# Patient Record
Sex: Female | Born: 1970 | Race: White | Hispanic: No | Marital: Married | Smoking: Never smoker
Health system: Southern US, Community
[De-identification: ages and names within clinical notes are randomized; demographics above are authoritative.]

## PROBLEM LIST (undated history)

## (undated) DIAGNOSIS — G10 Huntington's disease: Secondary | ICD-10-CM

## (undated) DIAGNOSIS — G1221 Amyotrophic lateral sclerosis: Secondary | ICD-10-CM

## (undated) DIAGNOSIS — R7303 Prediabetes: Secondary | ICD-10-CM

## (undated) HISTORY — PX: HAND SURGERY: SHX662

## (undated) HISTORY — PX: OTHER SURGICAL HISTORY: SHX169

## (undated) HISTORY — DX: Prediabetes: R73.03

---

## 2018-01-04 ENCOUNTER — Other Ambulatory Visit: Payer: Self-pay

## 2018-01-04 ENCOUNTER — Emergency Department (HOSPITAL_BASED_OUTPATIENT_CLINIC_OR_DEPARTMENT_OTHER): Payer: Medicare Other

## 2018-01-04 ENCOUNTER — Encounter (HOSPITAL_BASED_OUTPATIENT_CLINIC_OR_DEPARTMENT_OTHER): Payer: Self-pay

## 2018-01-04 ENCOUNTER — Emergency Department (HOSPITAL_BASED_OUTPATIENT_CLINIC_OR_DEPARTMENT_OTHER)
Admission: EM | Admit: 2018-01-04 | Discharge: 2018-01-04 | Disposition: A | Discharge status: Home / Self Care | Payer: Medicare Other | Attending: Emergency Medicine | Admitting: Emergency Medicine

## 2018-01-04 DIAGNOSIS — Y998 Other external cause status: Secondary | ICD-10-CM | POA: Diagnosis not present

## 2018-01-04 DIAGNOSIS — Z79899 Other long term (current) drug therapy: Secondary | ICD-10-CM | POA: Diagnosis not present

## 2018-01-04 DIAGNOSIS — S53104A Unspecified dislocation of right ulnohumeral joint, initial encounter: Secondary | ICD-10-CM | POA: Insufficient documentation

## 2018-01-04 DIAGNOSIS — W010XXA Fall on same level from slipping, tripping and stumbling without subsequent striking against object, initial encounter: Secondary | ICD-10-CM | POA: Diagnosis not present

## 2018-01-04 DIAGNOSIS — Y92017 Garden or yard in single-family (private) house as the place of occurrence of the external cause: Secondary | ICD-10-CM | POA: Diagnosis not present

## 2018-01-04 DIAGNOSIS — Q738 Other reduction defects of unspecified limb(s): Secondary | ICD-10-CM

## 2018-01-04 DIAGNOSIS — W19XXXA Unspecified fall, initial encounter: Secondary | ICD-10-CM

## 2018-01-04 DIAGNOSIS — S59901A Unspecified injury of right elbow, initial encounter: Secondary | ICD-10-CM | POA: Diagnosis present

## 2018-01-04 DIAGNOSIS — Y9301 Activity, walking, marching and hiking: Secondary | ICD-10-CM | POA: Insufficient documentation

## 2018-01-04 HISTORY — DX: Huntington's disease: G10

## 2018-01-04 LAB — HCG, SERUM, QUALITATIVE: Preg, Serum: NEGATIVE

## 2018-01-04 MED ORDER — SODIUM CHLORIDE 0.9 % IV SOLN: 1000.0000 mL | INTRAVENOUS | Status: DC

## 2018-01-04 MED ORDER — PROPOFOL 10 MG/ML IV BOLUS
40.0000 mg | Freq: Once | INTRAVENOUS | Status: AC
  Administered 2018-01-04: 80 mg via INTRAVENOUS
  Filled 2018-01-04: qty 20

## 2018-01-04 MED ORDER — SODIUM CHLORIDE 0.9 % IV BOLUS (SEPSIS)
1000.0000 mL | Freq: Once | INTRAVENOUS | Status: AC
  Administered 2018-01-04: 1000 mL via INTRAVENOUS

## 2018-01-04 MED ORDER — MORPHINE SULFATE (PF) 4 MG/ML IV SOLN
4.0000 mg | Freq: Once | INTRAVENOUS | Status: AC
  Administered 2018-01-04: 4 mg via INTRAVENOUS
  Filled 2018-01-04: qty 1

## 2018-01-04 MED ORDER — HYDROCODONE-ACETAMINOPHEN 5-325 MG PO TABS
2.0000 | ORAL_TABLET | Freq: Once | ORAL | Status: AC
  Administered 2018-01-04: 2 via ORAL
  Filled 2018-01-04: qty 2

## 2018-01-04 NOTE — ED Triage Notes (Signed)
Pt fell on steps approx 1215-pain to right UE-to triage in w/c

## 2018-01-04 NOTE — ED Provider Notes (Signed)
[facility] EMERGENCY DEPARTMENT Provider Note   CSN: [id number] Arrival date & time: 01/04/18  1253     History   Chief Complaint Chief Complaint  Patient presents with  . Fall    HPI Tanay Massiah is a 47 y.o. female.  HPI  Patient is a 47 year old female with a history of Huntington's disease presenting for fall and right arm pain.  Patient reports that she was walking out of her home today when she slipped on the mud and fell from a standing position.  Patient reports falling down onto her right shoulder and has subsequent pain rating from the right shoulder down to the right elbow.  Patient denies any head or neck trauma in this incident.  Patient denies any syncope reports this is a purely mechanical fall.  Patient denies any loss of sensation in the right arm distal to the injury.  Patient denies any hip or lower extremity pain.  Patient denies taking any blood thinners.  No remedies given prior to arrival for pain.  Past Medical History:  Diagnosis Date  . Huntington disease (HCC)     There are no active problems to display for this patient.   Past Surgical History:  Procedure Laterality Date  . HAND SURGERY       OB History   None      Home Medications    Prior to Admission medications   Medication Sig Start Date End Date Taking? Authorizing Provider  Clindamycin-Benzoyl Per, Refr, gel APP TO AFFECTED AREAS OF SKIN BID 12/13/17   [provider]  sertraline (ZOLOFT) 50 MG tablet TK 1 T PO IN THE MORNING 12/30/17   [provider]  traZODone (DESYREL) 50 MG tablet TK 1 T PO NIGHTLY 10/15/17   [provider]    Family History No family history on file.  Social History Social History   Tobacco Use  . Smoking status: Never Smoker  . Smokeless tobacco: Never Used  Substance Use Topics  . Alcohol use: Never    Frequency: Never  . Drug use: Never     Allergies   Patient has no known allergies.   Review of  Systems Review of Systems  Gastrointestinal: Negative for nausea and vomiting.  Musculoskeletal: Positive for arthralgias and joint swelling. Negative for back pain.  Skin: Negative for wound.  Neurological: Negative for syncope, weakness and numbness.  All other systems reviewed and are negative.    Physical Exam Updated Vital Signs BP (!) 125/108 (BP Location: Left Arm)   Pulse 79   Temp (!) 97.3 F (36.3 C) (Oral)   Resp 18   Ht 5\' 4"  (1.626 m)   Wt 68 kg (150 lb)   SpO2 99%   BMI 25.75 kg/m   Physical Exam  Constitutional: She appears well-developed and well-nourished.  HENT:  Head: Normocephalic and atraumatic.  Mouth/Throat: Oropharynx is clear and moist.  Eyes: Pupils are equal, round, and reactive to light. Conjunctivae and EOM are normal.  Neck: Normal range of motion. Neck supple.  Cardiovascular: Normal rate, regular rhythm, S1 normal and S2 normal.  No murmur heard. Pulmonary/Chest: Effort normal and breath sounds normal. She has no wheezes. She has no rales.  Abdominal: Soft. She exhibits no distension. There is no tenderness. There is no guarding.  Musculoskeletal:  Patient lying with the right arm in a slightly flexed position.  There is no step-off appreciated of the right shoulder.  No significant deformity of right humerus noted.  Patient  does have swelling noted to the right elbow. All sensation intact in the distal right fingers.  2+ radial pulse in the right upper extremity. Left shoulder, left arm, left elbow, and left wrist without tenderness to palpation. Bilateral hips and lowe extremities without tenderness.  Lymphadenopathy:    She has no cervical adenopathy.  Neurological: She is alert.  Cranial nerves grossly intact. Patient moves extremities symmetrically and with good coordination.  Skin: Skin is warm and dry. No rash noted. No erythema.  Psychiatric: She has a normal mood and affect. Her behavior is normal. Judgment and thought content  normal.  Nursing note and vitals reviewed.    ED Treatments / Results  Labs (all labs ordered are listed, but only abnormal results are displayed) Labs Reviewed - No data to display  EKG None  Radiology No results found.  Procedures Procedures (including critical care time)  Medications Ordered in ED Medications  HYDROcodone-acetaminophen (NORCO/VICODIN) 5-325 MG per tablet 2 tablet (has no administration in time range)     Initial Impression / Assessment and Plan / ED Course  I have reviewed the triage vital signs and the nursing notes.  Pertinent labs & imaging results that were available during my care of the patient were reviewed by me and considered in my medical decision making (see chart for details).     Patient well-appearing and neurovascularly intact in the right upper extremity.  X-ray demonstrated dislocation of the right elbow.  This was successfully reduced, under conscious sedation per Dr. Donnald GarrePfeiffer.  Postreduction films demonstrate possibly chronic bone fragment of the lateral epicondyle, but given that patient is splinted in a sugar tong, will have patient follow-up with orthopedics regarding this finding.  Patient remained neurovascularly intact after procedure.  Significant improvement in pain occurred after, therefore doubt further injury.  Patient returned to baseline, and was tolerating both solids and liquids after recovering from conscious sedation.  Patient is to follow-up with orthopedics within the next week.  Patient was given proper splint care instructions, and instructed to return for any increase in swelling of the fingers, discoloration, loss of sensation of the fingers of right hand.  Patient instructed to take Tylenol for discomfort/muscle soreness.  Patient is understanding and agrees with the plan of care.  This is a shared visit with Dr. Arby BarretteMarcy Pfeiffer. Patient was independently evaluated by this attending physician. Attending physician  consulted in evaluation and discharge management.  Final Clinical Impressions(s) / ED Diagnoses   Final diagnoses:  Dislocation of right elbow, initial encounter  Fall, initial encounter    ED Discharge Orders    None       Delia ChimesMurray, Keevon Henney B, PA-C 01/04/18 1706    Arby BarrettePfeiffer, Marcy, MD 01/05/18 [phone]

## 2018-01-04 NOTE — ED Notes (Signed)
PMS intact before and after. Pt tolerated well. All questions answered. 

## 2018-01-04 NOTE — ED Provider Notes (Signed)
Medical screening examination/treatment/procedure(s) were conducted as a shared visit with non-physician practitioner(s) and myself.  I personally evaluated the patient during the encounter.  None  Physical Exam  BP 120/74   Pulse 88   Temp 98.3 F (36.8 C) (Oral)   Resp 17   Ht 5\' 4"  (1.626 m)   Wt 68 kg (150 lb)   LMP 12/21/2017   SpO2 100%   BMI 25.75 kg/m   Physical Exam  Constitutional: She is oriented to person, place, and time. She appears well-developed and well-nourished.  Patient is alert and appropriate.  He does have Huntington's chorea and some Denyse Amass form movement.  She is however alert and follows commands.  No respiratory distress.  HENT:  Head: Normocephalic and atraumatic.  Mouth/Throat: Oropharynx is clear and moist.  Posterior oropharynx widely patent.  Eyes: EOM are normal.  Neck: Neck supple.  No C-spine tenderness.  Cardiovascular: Normal rate, regular rhythm and normal heart sounds.  Pulmonary/Chest: Effort normal and breath sounds normal. She exhibits no tenderness.  Musculoskeletal:  Large swelling and protrusion of olecranon on right arm.  Normal radial pulse.  Normal bilateral lower extremity range of motion without pain.  Neurological: She is alert and oriented to person, place, and time. She exhibits normal muscle tone.  Skin: Skin is warm and dry.  Psychiatric: She has a normal mood and affect.    ED Course/Procedures     .Sedation Date/Time: 01/04/2018 3:00 PM Performed by: Arby Barrette, MD Authorized by: Arby Barrette, MD   Consent:    Consent obtained:  Verbal   Consent given by:  Patient   Risks discussed:  Allergic reaction, dysrhythmia, inadequate sedation, nausea, prolonged hypoxia resulting in organ damage, prolonged sedation necessitating reversal, respiratory compromise necessitating ventilatory assistance and intubation and vomiting   Alternatives discussed:  Analgesia without sedation, anxiolysis and regional  anesthesia Universal protocol:    Procedure explained and questions answered to patient or proxy's satisfaction: yes     Relevant documents present and verified: yes     Test results available and properly labeled: yes     Imaging studies available: yes     Required blood products, implants, devices, and special equipment available: yes     Site/side marked: yes     Immediately prior to procedure a time out was called: yes     Patient identity confirmation method:  Verbally with patient Indications:    Procedure necessitating sedation performed by:  Physician performing sedation   Intended level of sedation:  Deep Pre-sedation assessment:    Time since last food or drink:  2 huor   ASA classification: class 1 - normal, healthy patient     Neck mobility: normal     Mouth opening:  3 or more finger widths   Thyromental distance:  4 finger widths   Mallampati score:  I - soft palate, uvula, fauces, pillars visible   Pre-sedation assessments completed and reviewed: airway patency, cardiovascular function, hydration status, mental status, nausea/vomiting, pain level, respiratory function and temperature   Immediate pre-procedure details:    Reassessment: Patient reassessed immediately prior to procedure     Reviewed: vital signs, relevant labs/tests and NPO status     Verified: bag valve mask available, emergency equipment available, intubation equipment available, IV patency confirmed, oxygen available and suction available   Procedure details (see MAR for exact dosages):    Preoxygenation:  Nasal cannula   Sedation:  Propofol   Intra-procedure monitoring:  Blood pressure monitoring, cardiac monitor, continuous  pulse oximetry, frequent LOC assessments, frequent vital sign checks and continuous capnometry   Intra-procedure events: none     Total Provider sedation time (minutes):  20 Post-procedure details:    Attendance: Constant attendance by certified staff until patient recovered      Recovery: Patient returned to pre-procedure baseline     Post-sedation assessments completed and reviewed: airway patency, cardiovascular function, hydration status, mental status, nausea/vomiting, pain level, respiratory function and temperature     Patient is stable for discharge or admission: yes     Patient tolerance:  Tolerated well, no immediate complications Reduction of dislocation Date/Time: 01/04/2018 3:40 PM Performed by: Arby BarrettePfeiffer, Jamarii Banks, MD Authorized by: Arby BarrettePfeiffer, Naraly Fritcher, MD  Consent: Verbal consent obtained. Written consent obtained. Consent given by: spouse and patient Patient understanding: patient states understanding of the procedure being performed Patient consent: the patient's understanding of the procedure matches consent given Procedure consent: procedure consent matches procedure scheduled Relevant documents: relevant documents present and verified Test results: test results available and properly labeled Imaging studies: imaging studies available Patient identity confirmed: verbally with patient and arm band Time out: Immediately prior to procedure a "time out" was called to verify the correct patient, procedure, equipment, support staff and site/side marked as required. Local anesthesia used: no  Anesthesia: Local anesthesia used: no  Sedation: Patient sedated: yes Sedation type: moderate (conscious) sedation Sedatives: propofol Sedation start date/time: 01/04/2018 3:00 PM Sedation end date/time: 01/04/2018 3:30 PM Vitals: Vital signs were monitored during sedation.  Patient tolerance: Patient tolerated the procedure well with no immediate complications Comments: Right elbow dislocation reduced by traction countertraction with extension of the arm.  Relocated without difficulty.  Good range of motion after relocation.  Patient neurovascularly intact with 2+ radial pulse.  Postreduction x-ray 2 views reviewed by myself.  Reduction shows good placement and  alignment without evident fracture.  Will also review radiologist postreduction interpretation.     MDM  Patient had mechanical fall as outlined above.  Isolated elbow dislocation identified.  Relocation is indicated in sedation and reduction notes.  Patient tolerated well.       Arby BarrettePfeiffer, Sheyenne Konz, MD 01/04/18 [phone]

## 2018-01-04 NOTE — Discharge Instructions (Signed)
Please see the information and instructions below regarding your visit.  Your diagnoses today include:  1. Dislocation of right elbow, initial encounter   2. Reduction defect of extremity   3. Fall, initial encounter     Tests performed today include: See side panel of your discharge paperwork for testing performed today. Vital signs are listed at the bottom of these instructions.   Your x-ray showed he had a dislocation, which we relocated.  The repeat x-ray shows that she may have a little bone spur that the radiologist was not completely sure if it was related to your fall, however we will keep you splinted until you see the orthopedic doctor.  Your shoulder x-ray was normal.  It showed no evidence of dislocation or fracture.  Medications prescribed:    Take any prescribed medications only as prescribed, and any over the counter medications only as directed on the packaging.  Please take Tylenol, 650 mg every 6 hours as needed for discomfort.  Home care instructions:  Please follow any educational materials contained in this packet.   Follow-up instructions:  Please follow up with Dr. Magnus IvanBlackman in one week for reevaluation.  Return instructions:  Please return to the Emergency Department if you experience worsening symptoms.  Please return to the emergency department if you have any persistent swelling, discoloration, or loss of sensation in the fingers of your right hand and even after loosening of the splint. Please return if you have any other emergent concerns.  Additional Information:   Your vital signs today were: BP 132/63    Pulse 88    Temp 98.3 F (36.8 C) (Oral)    Resp 11    Ht 5\' 4"  (1.626 m)    Wt 68 kg (150 lb)    LMP 12/21/2017    SpO2 98%    BMI 25.75 kg/m  If your blood pressure (BP) was elevated on multiple readings during this visit above 130 for the top number or above 80 for the bottom number, please have this repeated by your primary care provider  within one month. --------------  Thank you for allowing us to participate in your care today.

## 2018-01-04 NOTE — Sedation Documentation (Signed)
PT sitting up in bed, talking. States she is feeling much better. Asking for a drink.

## 2018-01-11 ENCOUNTER — Ambulatory Visit (INDEPENDENT_AMBULATORY_CARE_PROVIDER_SITE_OTHER): Payer: Medicare Other | Admitting: Orthopaedic Surgery

## 2018-01-11 ENCOUNTER — Encounter (INDEPENDENT_AMBULATORY_CARE_PROVIDER_SITE_OTHER): Payer: Self-pay | Admitting: Orthopaedic Surgery

## 2018-01-11 DIAGNOSIS — S53104A Unspecified dislocation of right ulnohumeral joint, initial encounter: Secondary | ICD-10-CM | POA: Diagnosis not present

## 2018-01-11 NOTE — Progress Notes (Signed)
   Office Visit Note   Patient: Barbara Gallegos           Date of Birth: Jul 23, 1970           MRN: [id number] Visit Date: 01/11/2018              Requested by: No referring provider defined for this encounter. PCP: Patient, No Pcp Per   Assessment & Plan: Visit Diagnoses:  1. Elbow dislocation, right, initial encounter     Plan: At this point she does not need to be in any type of splint now and can work on elbow motion but should avoid any type of contact sports for at least another 6 weeks to allow the elbow  ligaments to stiffen up.  She can wear a sling for another week for comfort but does not need any splinting.  All questions and concerns were answered and addressed.  Follow-up will be as needed.  Follow-Up Instructions: Return if symptoms worsen or fail to improve.   Orders:  No orders of the defined types were placed in this encounter.  No orders of the defined types were placed in this encounter.     Procedures: No procedures performed   Clinical Data: No additional findings.   Subjective: Chief Complaint  Patient presents with  . Right Elbow - Injury  The patient is a very pleasant 47 year old female with Huntington's disease who had a mechanical fall a week ago landing on mud landing on her outstretched right arm.  She sustained a dislocation of the right elbow.  This was reduced at outlying medical facility.  She is placed in a splint and given follow-up in our office.  She does report a lot of recent falls as a relates to her Huntington's chorea.  She denies any numbness and tingling in her right hand.  HPI  Review of Systems She currently denies any headache, chest pain, shortness of breath, fever, chills, nausea, vomiting.  Objective: Vital Signs: LMP 12/21/2017   Physical Exam She is alert and oriented x3 and in no acute distress Ortho Exam Examination of the right upper extremity shows extensive bruising from the arm all the way down the hand.   Her elbow has full flexion almost full extension.  Her pronation and supination are full with some mild elbow pain.  The elbow feels like mostly stable.  Distally her motor and sensory exam is entirely normal and her right hand. Specialty Comments:  No specialty comments available.  Imaging: No results found. Independent review of x-rays of her right elbow show pre-and postreduction films.  The elbow joint is well aligned post reduction.  PMFS History: There are no active problems to display for this patient.  Past Medical History:  Diagnosis Date  . Huntington disease [facility](HCC)     History reviewed. No pertinent family history.  Past Surgical History:  Procedure Laterality Date  . HAND SURGERY     Social History   Occupational History  . Not on file  Tobacco Use  . Smoking status: Never Smoker  . Smokeless tobacco: Never Used  Substance and Sexual Activity  . Alcohol use: Never    Frequency: Never  . Drug use: Never  . Sexual activity: Not on file

## 2018-07-15 ENCOUNTER — Other Ambulatory Visit: Payer: Self-pay

## 2018-07-15 ENCOUNTER — Emergency Department (HOSPITAL_BASED_OUTPATIENT_CLINIC_OR_DEPARTMENT_OTHER): Payer: Medicare Other

## 2018-07-15 ENCOUNTER — Emergency Department (HOSPITAL_BASED_OUTPATIENT_CLINIC_OR_DEPARTMENT_OTHER)
Admission: EM | Admit: 2018-07-15 | Discharge: 2018-07-15 | Disposition: A | Discharge status: Home / Self Care | Payer: Medicare Other | Attending: Emergency Medicine | Admitting: Emergency Medicine

## 2018-07-15 ENCOUNTER — Encounter (HOSPITAL_BASED_OUTPATIENT_CLINIC_OR_DEPARTMENT_OTHER): Payer: Self-pay | Admitting: *Deleted

## 2018-07-15 DIAGNOSIS — N390 Urinary tract infection, site not specified: Secondary | ICD-10-CM | POA: Insufficient documentation

## 2018-07-15 DIAGNOSIS — Y929 Unspecified place or not applicable: Secondary | ICD-10-CM | POA: Insufficient documentation

## 2018-07-15 DIAGNOSIS — Y998 Other external cause status: Secondary | ICD-10-CM | POA: Insufficient documentation

## 2018-07-15 DIAGNOSIS — Z79899 Other long term (current) drug therapy: Secondary | ICD-10-CM | POA: Diagnosis not present

## 2018-07-15 DIAGNOSIS — W19XXXA Unspecified fall, initial encounter: Secondary | ICD-10-CM

## 2018-07-15 DIAGNOSIS — Y9389 Activity, other specified: Secondary | ICD-10-CM | POA: Diagnosis not present

## 2018-07-15 DIAGNOSIS — S0101XA Laceration without foreign body of scalp, initial encounter: Secondary | ICD-10-CM | POA: Insufficient documentation

## 2018-07-15 DIAGNOSIS — S0990XA Unspecified injury of head, initial encounter: Secondary | ICD-10-CM | POA: Diagnosis present

## 2018-07-15 DIAGNOSIS — W0110XA Fall on same level from slipping, tripping and stumbling with subsequent striking against unspecified object, initial encounter: Secondary | ICD-10-CM | POA: Insufficient documentation

## 2018-07-15 LAB — CBC WITH DIFFERENTIAL/PLATELET
Abs Immature Granulocytes: 0.04 10*3/uL (ref 0.00–0.07)
BASOS PCT: 0 %
Basophils Absolute: 0.1 10*3/uL (ref 0.0–0.1)
EOS ABS: 0.1 10*3/uL (ref 0.0–0.5)
EOS PCT: 1 %
HCT: 42.3 % (ref 36.0–46.0)
Hemoglobin: 13.6 g/dL (ref 12.0–15.0)
Immature Granulocytes: 0 %
Lymphocytes Relative: 6 %
Lymphs Abs: 0.9 10*3/uL (ref 0.7–4.0)
MCH: 28.5 pg (ref 26.0–34.0)
MCHC: 32.2 g/dL (ref 30.0–36.0)
MCV: 88.7 fL (ref 80.0–100.0)
Monocytes Absolute: 0.5 10*3/uL (ref 0.1–1.0)
Monocytes Relative: 3 %
Neutro Abs: 13.2 10*3/uL — ABNORMAL HIGH (ref 1.7–7.7)
Neutrophils Relative %: 90 %
Platelets: 320 10*3/uL (ref 150–400)
RBC: 4.77 MIL/uL (ref 3.87–5.11)
RDW: 12.3 % (ref 11.5–15.5)
WBC: 14.8 10*3/uL — AB (ref 4.0–10.5)
nRBC: 0 % (ref 0.0–0.2)

## 2018-07-15 LAB — COMPREHENSIVE METABOLIC PANEL
ALK PHOS: 45 U/L (ref 38–126)
ALT: 14 U/L (ref 0–44)
AST: 20 U/L (ref 15–41)
Albumin: 4.5 g/dL (ref 3.5–5.0)
Anion gap: 7 (ref 5–15)
BILIRUBIN TOTAL: 0.6 mg/dL (ref 0.3–1.2)
BUN: 17 mg/dL (ref 6–20)
CALCIUM: 9.2 mg/dL (ref 8.9–10.3)
CHLORIDE: 106 mmol/L (ref 98–111)
CO2: 25 mmol/L (ref 22–32)
CREATININE: 0.65 mg/dL (ref 0.44–1.00)
Glucose, Bld: 159 mg/dL — ABNORMAL HIGH (ref 70–99)
Potassium: 4 mmol/L (ref 3.5–5.1)
Sodium: 138 mmol/L (ref 135–145)
TOTAL PROTEIN: 8.1 g/dL (ref 6.5–8.1)

## 2018-07-15 LAB — URINALYSIS, ROUTINE W REFLEX MICROSCOPIC
BILIRUBIN URINE: NEGATIVE
Glucose, UA: NEGATIVE mg/dL
Ketones, ur: NEGATIVE mg/dL
Leukocytes, UA: NEGATIVE
Nitrite: NEGATIVE
Protein, ur: NEGATIVE mg/dL
Specific Gravity, Urine: 1.01 (ref 1.005–1.030)
pH: 5.5 (ref 5.0–8.0)

## 2018-07-15 LAB — URINALYSIS, MICROSCOPIC (REFLEX)

## 2018-07-15 LAB — PREGNANCY, URINE: PREG TEST UR: NEGATIVE

## 2018-07-15 MED ORDER — CEPHALEXIN 500 MG PO CAPS: 500.0000 mg | ORAL_CAPSULE | Freq: Two times a day (BID) | ORAL | 0 refills | Status: DC

## 2018-07-15 MED ORDER — SODIUM CHLORIDE 0.9 % IV BOLUS
500.0000 mL | Freq: Once | INTRAVENOUS | Status: AC
  Administered 2018-07-15: 500 mL via INTRAVENOUS

## 2018-07-15 MED ORDER — SODIUM CHLORIDE 0.9 % IV SOLN
INTRAVENOUS | Status: DC | PRN
  Administered 2018-07-15: 250 mL via INTRAVENOUS

## 2018-07-15 MED ORDER — SODIUM CHLORIDE 0.9 % IV SOLN
1.0000 g | Freq: Once | INTRAVENOUS | Status: AC
  Administered 2018-07-15: 1 g via INTRAVENOUS
  Filled 2018-07-15: qty 10

## 2018-07-15 MED ORDER — ONDANSETRON 4 MG PO TBDP: 4.0000 mg | ORAL_TABLET | Freq: Three times a day (TID) | ORAL | 0 refills | Status: DC | PRN

## 2018-07-15 MED ORDER — IBUPROFEN 400 MG PO TABS
600.0000 mg | ORAL_TABLET | Freq: Once | ORAL | Status: AC
  Administered 2018-07-15: 600 mg via ORAL
  Filled 2018-07-15: qty 1

## 2018-07-15 NOTE — ED Notes (Signed)
Pt awre we need urine specimen

## 2018-07-15 NOTE — ED Provider Notes (Signed)
[facility] EMERGENCY DEPARTMENT Provider Note   CSN: [id number] Arrival date & time: 07/15/18  1442     History   Chief Complaint Chief Complaint  Patient presents with  . Fall    HPI Barbara Gallegos is a 47 y.o. female.  The history is provided by the patient and a significant other. No language interpreter was used.  Fall    Barbara Gallegos is a 47 y.o. female who presents to the Emergency Department complaining of fall. Presents to the emergency department accompanied by her husband for evaluation of injuries following a fall. She has a history of Huntington disease and does have frequent falls. She is been feeling poorly today and became sick last night with fever, cough, congestion, nausea. When she is ill she gets what she describes as fatigue and brain fogs and she is at higher risk for falls. Today she fell and hit her head. Unsure if she had loss of consciousness. She currently feels like she is at her baseline. She does have four children at home that have been sick with URI symptoms. She did not receive a flu shot this year. She does not take any blood thinners. No vomiting. No diarrhea, dysuria. Past Medical History:  Diagnosis Date  . Huntington disease (HCC)     There are no active problems to display for this patient.   Past Surgical History:  Procedure Laterality Date  . HAND SURGERY       OB History   No obstetric history on file.      Home Medications    Prior to Admission medications   Medication Sig Start Date End Date Taking? Authorizing Provider  cephALEXin (KEFLEX) 500 MG capsule Take 1 capsule (500 mg total) by mouth 2 (two) times daily. 07/15/18   Tilden Fossa, MD  Clindamycin-Benzoyl Per, Refr, gel APP TO AFFECTED AREAS OF SKIN BID 12/13/17   [provider]  ondansetron (ZOFRAN ODT) 4 MG disintegrating tablet Take 1 tablet (4 mg total) by mouth every 8 (eight) hours as needed for nausea or vomiting. 07/15/18   Tilden Fossa, MD  sertraline (ZOLOFT) 50 MG tablet TK 1 T PO IN THE MORNING 12/30/17   [provider]  traZODone (DESYREL) 50 MG tablet TK 1 T PO NIGHTLY 10/15/17   [provider]    Family History History reviewed. No pertinent family history.  Social History Social History   Tobacco Use  . Smoking status: Never Smoker  . Smokeless tobacco: Never Used  Substance Use Topics  . Alcohol use: Never    Frequency: Never  . Drug use: Never     Allergies   Patient has no known allergies.   Review of Systems Review of Systems  All other systems reviewed and are negative.    Physical Exam Updated Vital Signs BP (!) 118/54 (BP Location: Right Arm)   Pulse 79   Temp 98.3 F (36.8 C) (Oral)   Resp 16   Ht 5\' 4"  (1.626 m)   Wt 72.6 kg   LMP 06/24/2018   SpO2 99%   BMI 27.46 kg/m   Physical Exam Vitals signs and nursing note reviewed.  Constitutional:      Appearance: She is well-developed.  HENT:     Head: Normocephalic.     Comments: 3 cm laceration to the occipital scalp Neck:     Musculoskeletal: Neck supple.  Cardiovascular:     Rate and Rhythm: Normal rate and regular rhythm.  Heart sounds: No murmur.  Pulmonary:     Effort: Pulmonary effort is normal. No respiratory distress.     Breath sounds: Normal breath sounds.  Abdominal:     Palpations: Abdomen is soft.     Tenderness: There is no abdominal tenderness. There is no guarding or rebound.  Musculoskeletal:        General: No tenderness.  Skin:    General: Skin is warm and dry.     Capillary Refill: Capillary refill takes less than 2 seconds.  Neurological:     Mental Status: She is alert and oriented to person, place, and time.  Psychiatric:        Mood and Affect: Mood normal.        Behavior: Behavior normal.      ED Treatments / Results  Labs (all labs ordered are listed, but only abnormal results are displayed) Labs Reviewed  URINALYSIS, ROUTINE W REFLEX MICROSCOPIC -  Abnormal; Notable for the following components:      Result Value   Hgb urine dipstick TRACE (*)    All other components within normal limits  COMPREHENSIVE METABOLIC PANEL - Abnormal; Notable for the following components:   Glucose, Bld 159 (*)    All other components within normal limits  CBC WITH DIFFERENTIAL/PLATELET - Abnormal; Notable for the following components:   WBC 14.8 (*)    Neutro Abs 13.2 (*)    All other components within normal limits  URINALYSIS, MICROSCOPIC (REFLEX) - Abnormal; Notable for the following components:   Bacteria, UA MANY (*)    All other components within normal limits  URINE CULTURE  PREGNANCY, URINE    EKG None  Radiology Dg Chest 2 View  Result Date: 07/15/2018 CLINICAL DATA:  Syncopal episode this am, fell and hit back of head. Huntington's dz. Fever, weak, and cough since last pm. EXAM: CHEST - 2 VIEW COMPARISON:  None. FINDINGS: Subtle shallow inspiration heart size is normal. The lungs are free of consolidations and pleural effusions. No pneumothorax or displaced fractures. IMPRESSION: Low lung volumes. No evidence for acute cardiopulmonary abnormality. Electronically Signed   By: Norva PavlovElizabeth  Brown M.D.   On: 07/15/2018 17:06    Procedures .Marland Kitchen.Laceration Repair Date/Time: 07/15/2018 5:42 PM Performed by: Tilden Fossaees, Kamen Hanken, MD Authorized by: Tilden Fossaees, Travious Vanover, MD   Consent:    Consent obtained:  Verbal   Consent given by:  Patient   Risks discussed:  Infection and pain Laceration details:    Location:  Scalp   Scalp location:  Occipital   Length (cm):  3 Repair type:    Repair type:  Simple Treatment:    Area cleansed with:  Saline   Amount of cleaning:  Standard Skin repair:    Repair method:  Staples   Number of staples:  2 Post-procedure details:    Dressing:  Open (no dressing)   Patient tolerance of procedure:  Tolerated well, no immediate complications   (including critical care time)  Medications Ordered in ED Medications   sodium chloride 0.9 % bolus 500 mL (0 mLs Intravenous Stopped 07/15/18 1757)  ibuprofen (ADVIL,MOTRIN) tablet 600 mg (600 mg Oral Given 07/15/18 1809)  cefTRIAXone (ROCEPHIN) 1 g in sodium chloride 0.9 % 100 mL IVPB (0 g Intravenous Stopped 07/15/18 1948)     Initial Impression / Assessment and Plan / ED Course  I have reviewed the triage vital signs and the nursing notes.  Pertinent labs & imaging results that were available during my care of the patient were  reviewed by me and considered in my medical decision making (see chart for details).    Patient with history of Huntington's disease here for evaluation of injuries following a fall. She has a scalp laceration that was repaired per procedure note. No concerning features for serious closed head injury. In terms of her fall she has been ill recently with fevers, nausea, cough. Chest x-ray with no evidence of pneumonia. UA is concerning for UTI in setting of her symptoms. CBC with mild leukocytosis. No concerning features for sepsis at this time. She was treated with Rocephin in the emergency department for UTI with prescription for Keflex. Discussed with patient and husband home care for UTI as well as scalp laceration. Return precautions discussed.  Final Clinical Impressions(s) / ED Diagnoses   Final diagnoses:  Fall, initial encounter  Laceration of scalp, initial encounter  Acute UTI    ED Discharge Orders         Ordered    cephALEXin (KEFLEX) 500 MG capsule  2 times daily     07/15/18 1905    ondansetron (ZOFRAN ODT) 4 MG disintegrating tablet  Every 8 hours PRN     07/15/18 1905           Tilden Fossaees, Maxen Rowland, MD 07/16/18 [id number]

## 2018-07-15 NOTE — ED Notes (Signed)
Patient transported to X-ray 

## 2018-07-15 NOTE — ED Notes (Signed)
Stapler and Wound cleanse solution at bedside

## 2018-07-15 NOTE — ED Triage Notes (Signed)
Pt tripped and fell, hitting her posterior head. Per pts husband, denies LOC.  Pt per her norm since that time.  Large laceration to posterior head with bleeding controlled.

## 2018-07-15 NOTE — ED Notes (Signed)
Pt states she felt dizzy tripped over bed linen, and fell, hitting the back of her head. Pt denies loss of consciousness. Pt denies nausea or vomiting, no blurry vision.

## 2018-07-15 NOTE — ED Notes (Signed)
ED Provider at bedside. 

## 2018-07-17 LAB — URINE CULTURE: Culture: 30000 — AB

## 2018-07-21 ENCOUNTER — Emergency Department (HOSPITAL_BASED_OUTPATIENT_CLINIC_OR_DEPARTMENT_OTHER)
Admission: EM | Admit: 2018-07-21 | Discharge: 2018-07-21 | Disposition: A | Discharge status: Home / Self Care | Payer: Medicare Other | Attending: Emergency Medicine | Admitting: Emergency Medicine

## 2018-07-21 ENCOUNTER — Encounter (HOSPITAL_BASED_OUTPATIENT_CLINIC_OR_DEPARTMENT_OTHER): Payer: Self-pay

## 2018-07-21 ENCOUNTER — Other Ambulatory Visit: Payer: Self-pay

## 2018-07-21 DIAGNOSIS — Z79899 Other long term (current) drug therapy: Secondary | ICD-10-CM | POA: Insufficient documentation

## 2018-07-21 DIAGNOSIS — S0990XD Unspecified injury of head, subsequent encounter: Secondary | ICD-10-CM | POA: Insufficient documentation

## 2018-07-21 DIAGNOSIS — S0993XA Unspecified injury of face, initial encounter: Secondary | ICD-10-CM | POA: Diagnosis present

## 2018-07-21 DIAGNOSIS — Y999 Unspecified external cause status: Secondary | ICD-10-CM | POA: Insufficient documentation

## 2018-07-21 DIAGNOSIS — Y929 Unspecified place or not applicable: Secondary | ICD-10-CM | POA: Diagnosis not present

## 2018-07-21 DIAGNOSIS — Y939 Activity, unspecified: Secondary | ICD-10-CM | POA: Diagnosis not present

## 2018-07-21 DIAGNOSIS — S0081XA Abrasion of other part of head, initial encounter: Secondary | ICD-10-CM | POA: Insufficient documentation

## 2018-07-21 DIAGNOSIS — W19XXXA Unspecified fall, initial encounter: Secondary | ICD-10-CM | POA: Insufficient documentation

## 2018-07-21 DIAGNOSIS — Z4802 Encounter for removal of sutures: Secondary | ICD-10-CM | POA: Insufficient documentation

## 2018-07-21 NOTE — ED Provider Notes (Signed)
[facility] EMERGENCY DEPARTMENT Provider Note   CSN: [id number] Arrival date & time: 07/21/18  1321     History   Chief Complaint Chief Complaint  Patient presents with  . Fall  . Suture / Staple Removal    HPI Barbara Gallegos is a 48 y.o. female.  HPI Patient is a 48 year old female who presents the emergency department with request for staple removal.  Staples were placed 5 days ago into the posterior scalp.  After close head injury.  She has a history of Huntington's.  She has instability.  She fell again 2 days ago striking her left forehead without laceration.  Small abrasion noted.  No vomiting.  No weakness of arms or legs.  No altered mental status.  No use of anticoagulants.  Mild left frontal headache.   Past Medical History:  Diagnosis Date  . Huntington disease (HCC)     There are no active problems to display for this patient.   Past Surgical History:  Procedure Laterality Date  . HAND SURGERY       OB History   No obstetric history on file.      Home Medications    Prior to Admission medications   Medication Sig Start Date End Date Taking? Authorizing Provider  cephALEXin (KEFLEX) 500 MG capsule Take 1 capsule (500 mg total) by mouth 2 (two) times daily. 07/15/18   Tilden Fossa, MD  Clindamycin-Benzoyl Per, Refr, gel APP TO AFFECTED AREAS OF SKIN BID 12/13/17   [provider]  ondansetron (ZOFRAN ODT) 4 MG disintegrating tablet Take 1 tablet (4 mg total) by mouth every 8 (eight) hours as needed for nausea or vomiting. 07/15/18   Tilden Fossa, MD  sertraline (ZOLOFT) 50 MG tablet TK 1 T PO IN THE MORNING 12/30/17   [provider]  traZODone (DESYREL) 50 MG tablet TK 1 T PO NIGHTLY 10/15/17   [provider]    Family History No family history on file.  Social History Social History   Tobacco Use  . Smoking status: Never Smoker  . Smokeless tobacco: Never Used  Substance Use Topics  . Alcohol use:  Never    Frequency: Never  . Drug use: Never     Allergies   Patient has no known allergies.   Review of Systems Review of Systems  All other systems reviewed and are negative.    Physical Exam Updated Vital Signs BP 117/62 (BP Location: Left Arm)   Pulse 89   Temp 97.6 F (36.4 C) (Oral)   Resp 18   LMP 06/24/2018   SpO2 98%   Physical Exam Vitals signs and nursing note reviewed.  Constitutional:      General: She is not in acute distress.    Appearance: She is well-developed.  HENT:     Head: Normocephalic and atraumatic.  Eyes:     Pupils: Pupils are equal, round, and reactive to light.  Neck:     Musculoskeletal: Normal range of motion.  Cardiovascular:     Rate and Rhythm: Normal rate and regular rhythm.     Heart sounds: Normal heart sounds.  Pulmonary:     Effort: Pulmonary effort is normal.     Breath sounds: Normal breath sounds.  Abdominal:     General: There is no distension.     Palpations: Abdomen is soft.     Tenderness: There is no abdominal tenderness.  Musculoskeletal: Normal range of motion.  Skin:    General: Skin is warm  and dry.  Neurological:     Mental Status: She is alert and oriented to person, place, and time.     Comments: 5/5 strength in major muscle groups of  bilateral upper and lower extremities. Speech normal. No facial asymetry.   Psychiatric:        Judgment: Judgment normal.      ED Treatments / Results  Labs (all labs ordered are listed, but only abnormal results are displayed) Labs Reviewed - No data to display  EKG None  Radiology No results found.  Procedures .Suture Removal Performed by: Azalia Bilisampos, Adric Wrede, MD Authorized by: Azalia Bilisampos, Braelynn Lupton, MD   Consent:    Consent obtained:  Verbal   Consent given by:  Patient   Alternatives discussed:  No treatment Location:    Location:  Head/neck   Head/neck location:  Scalp Procedure details:    Wound appearance:  No signs of infection   Number of staples  removed:  2 Post-procedure details:    Patient tolerance of procedure:  Tolerated well, no immediate complications   (including critical care time)  Medications Ordered in ED Medications - No data to display   Initial Impression / Assessment and Plan / ED Course  I have reviewed the triage vital signs and the nursing notes.  Pertinent labs & imaging results that were available during my care of the patient were reviewed by me and considered in my medical decision making (see chart for details).    Staples removed.  Normal neurologic exam.  No indication for advanced imaging today.  Family requesting referral to a local neurologist to help manage her Huntington's [facility]  Final Clinical Impressions(s) / ED Diagnoses   Final diagnoses:  Removal of staple    ED Discharge Orders    None       Azalia Bilisampos, Kandra Graven, MD 07/21/18 1416

## 2018-07-21 NOTE — ED Triage Notes (Addendum)
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

## 2018-10-18 ENCOUNTER — Emergency Department (HOSPITAL_BASED_OUTPATIENT_CLINIC_OR_DEPARTMENT_OTHER)
Admission: EM | Admit: 2018-10-18 | Discharge: 2018-10-18 | Disposition: A | Discharge status: Home / Self Care | Payer: Medicare Other | Attending: Emergency Medicine | Admitting: Emergency Medicine

## 2018-10-18 ENCOUNTER — Emergency Department (HOSPITAL_BASED_OUTPATIENT_CLINIC_OR_DEPARTMENT_OTHER): Payer: Medicare Other

## 2018-10-18 ENCOUNTER — Encounter (HOSPITAL_BASED_OUTPATIENT_CLINIC_OR_DEPARTMENT_OTHER): Payer: Self-pay | Admitting: Emergency Medicine

## 2018-10-18 ENCOUNTER — Other Ambulatory Visit: Payer: Self-pay

## 2018-10-18 DIAGNOSIS — M25562 Pain in left knee: Secondary | ICD-10-CM | POA: Diagnosis not present

## 2018-10-18 DIAGNOSIS — B9789 Other viral agents as the cause of diseases classified elsewhere: Secondary | ICD-10-CM | POA: Diagnosis not present

## 2018-10-18 DIAGNOSIS — Z79899 Other long term (current) drug therapy: Secondary | ICD-10-CM | POA: Insufficient documentation

## 2018-10-18 DIAGNOSIS — J069 Acute upper respiratory infection, unspecified: Secondary | ICD-10-CM | POA: Diagnosis not present

## 2018-10-18 DIAGNOSIS — R05 Cough: Secondary | ICD-10-CM | POA: Diagnosis present

## 2018-10-18 DIAGNOSIS — S8992XA Unspecified injury of left lower leg, initial encounter: Secondary | ICD-10-CM

## 2018-10-18 DIAGNOSIS — W19XXXA Unspecified fall, initial encounter: Secondary | ICD-10-CM

## 2018-10-18 LAB — PREGNANCY, URINE: Preg Test, Ur: NEGATIVE

## 2018-10-18 MED ORDER — BENZONATATE 100 MG PO CAPS: 100.0000 mg | ORAL_CAPSULE | Freq: Three times a day (TID) | ORAL | 0 refills | Status: DC

## 2018-10-18 NOTE — ED Notes (Signed)
XR at bedside

## 2018-10-18 NOTE — ED Notes (Signed)
Patient verbalizes understanding of discharge instructions. Opportunity for questioning and answers were provided. Armband removed by staff, pt discharged from ED.  

## 2018-10-18 NOTE — ED Notes (Addendum)
Pt states she fells lightheaded and dizzy, assisted back to chair from bed. RN notified

## 2018-10-18 NOTE — ED Notes (Signed)
Pt declines knee sleeve, has one at home with braces

## 2018-10-18 NOTE — ED Provider Notes (Signed)
[facility] EMERGENCY DEPARTMENT Provider Note   CSN: [id number] Arrival date & time: 10/18/18  1343    History   Chief Complaint Chief Complaint  Patient presents with  . Cough    HPI Barbara Gallegos is a 48 y.o. female with history of Huntington's disease presenting today for URI-like illness x1 week.  Patient reports that 7 days ago she developed fever/chills, cough, body aches rhinorrhea and congestion.  She states that she initially treated her fever with Tylenol/ibuprofen and she has been afebrile for the past 4 days.  She denies any antipyretic medications in the last 4 days.  Patient reports that she has been using NyQuil for her cough with minimal relief.  She describes a intermittent nonproductive cough with no shortness of breath or chest pain. (Triage note reviewed and shows patient reported SOB; when directly addressed patient denies history of shortness of breath, possible charting error.) Patient states that her symptoms have not changed in the past 4 days in relation to her cough or body aches.  Patient is concerned for COVID-19 today.  Patient presents today with her husband who is been experiencing similar symptoms for the past 10 days.  Additionally patient reports fall onto her left knee 2 days ago.  Patient states that she falls due to her Huntington's disease quite often and she has been following up with her primary care provider regarding this.  She states that 2 days ago she lost her balance and fell onto her left knee.  She denies falling completely to the ground, head injury, loss of consciousness, neck/back pain or any other concerns.  She describes a mild intensity anterior knee pain constant worsened with palpation ambulation and without alleviating factors.  Patient has been ambulatory on the knee without difficulty since time of injury.     HPI  Past Medical History:  Diagnosis Date  . Huntington disease (HCC)     There are no active problems  to display for this patient.   Past Surgical History:  Procedure Laterality Date  . HAND SURGERY       OB History   No obstetric history on file.      Home Medications    Prior to Admission medications   Medication Sig Start Date End Date Taking? Authorizing Provider  benzonatate (TESSALON) 100 MG capsule Take 1 capsule (100 mg total) by mouth every 8 (eight) hours. 10/18/18   Harlene Salts A, PA-C  cephALEXin (KEFLEX) 500 MG capsule Take 1 capsule (500 mg total) by mouth 2 (two) times daily. 07/15/18   Tilden Fossa, MD  Clindamycin-Benzoyl Per, Refr, gel APP TO AFFECTED AREAS OF SKIN BID 12/13/17   [provider]  ondansetron (ZOFRAN ODT) 4 MG disintegrating tablet Take 1 tablet (4 mg total) by mouth every 8 (eight) hours as needed for nausea or vomiting. 07/15/18   Tilden Fossa, MD  sertraline (ZOLOFT) 50 MG tablet TK 1 T PO IN THE MORNING 12/30/17   [provider]  traZODone (DESYREL) 50 MG tablet TK 1 T PO NIGHTLY 10/15/17   [provider]    Family History No family history on file.  Social History Social History   Tobacco Use  . Smoking status: Never Smoker  . Smokeless tobacco: Never Used  Substance Use Topics  . Alcohol use: Never    Frequency: Never  . Drug use: Never     Allergies   Patient has no known allergies.   Review of Systems Review of Systems  Constitutional: Positive for chills (No chills for past 4 days) and fever (Afebrile for past 4 days).  HENT: Positive for congestion and rhinorrhea. Negative for facial swelling, sore throat, trouble swallowing and voice change.   Respiratory: Positive for cough. Negative for shortness of breath.   Cardiovascular: Negative.  Negative for chest pain and leg swelling.  Gastrointestinal: Negative.  Negative for abdominal pain, diarrhea, nausea and vomiting.  Musculoskeletal: Positive for arthralgias (Left knee pain following fall 2 days ago). Negative for myalgias, neck pain  and neck stiffness.  Skin: Negative.  Negative for color change and wound.  Neurological: Negative.  Negative for dizziness, syncope, weakness, numbness and headaches.   Physical Exam Updated Vital Signs BP 105/73 (BP Location: Right Arm)   Pulse 75   Temp 98.4 F (36.9 C) (Oral)   Resp 14   Ht 5\' 4"  (1.626 m)   Wt 63.5 kg   LMP 09/20/2018   SpO2 100%   BMI 24.03 kg/m   Physical Exam Constitutional:      General: She is not in acute distress.    Appearance: Normal appearance. She is well-developed. She is not ill-appearing or diaphoretic.  HENT:     Head: Normocephalic and atraumatic. No raccoon eyes or Battle's sign.     Jaw: There is normal jaw occlusion.     Right Ear: Tympanic membrane, ear canal and external ear normal. No hemotympanum.     Left Ear: Tympanic membrane, ear canal and external ear normal. No hemotympanum.     Nose: Nose normal.     Mouth/Throat:     Lips: Pink.     Mouth: Mucous membranes are moist.     Pharynx: Oropharynx is clear. Uvula midline.  Eyes:     General: Vision grossly intact. Gaze aligned appropriately.     Extraocular Movements: Extraocular movements intact.     Conjunctiva/sclera: Conjunctivae normal.     Pupils: Pupils are equal, round, and reactive to light.  Neck:     Musculoskeletal: Normal range of motion and neck supple.     Trachea: Trachea and phonation normal. No tracheal tenderness or tracheal deviation.  Cardiovascular:     Rate and Rhythm: Normal rate and regular rhythm.     Pulses:          Dorsalis pedis pulses are 2+ on the right side and 2+ on the left side.       Posterior tibial pulses are 2+ on the right side and 2+ on the left side.     Heart sounds: Normal heart sounds.  Pulmonary:     Effort: Pulmonary effort is normal. No accessory muscle usage or respiratory distress.     Breath sounds: Normal breath sounds and air entry. No wheezing or rhonchi.  Chest:     Chest wall: No tenderness.  Abdominal:      General: There is no distension.     Palpations: Abdomen is soft.     Tenderness: There is no abdominal tenderness. There is no guarding or rebound.  Musculoskeletal: Normal range of motion.     Left knee: She exhibits normal range of motion and no effusion. Tenderness found.     Right lower leg: Normal. No edema.     Left lower leg: Normal. No edema.     Comments: Left Knee:  Small area of ecchymosis noted over tibial tuberosity. No obvious deformity. No skin swelling, erythema, heat, fluctuance or break of the skin.  Tenderness to palpation over tibial tuberosity. Active  and passive flexion and extension intact without pain or crepitus. Negative anterior/poster drawer bilaterally. No varus or valgus laxity or locking. No tenderness to palpation of hips or ankles.Compartments soft. Neurovascularly intact distally to site of injury.  Feet:     Right foot:     Protective Sensation: 3 sites tested. 3 sites sensed.     Left foot:     Protective Sensation: 3 sites tested. 3 sites sensed.  Skin:    General: Skin is warm and dry.  Neurological:     Mental Status: She is alert.     GCS: GCS eye subscore is 4. GCS verbal subscore is 5. GCS motor subscore is 6.     Comments: Speech is clear and goal oriented, follows commands Major Cranial nerves without deficit, no facial droop Moves extremities without ataxia, coordination intact Slow gait; patient and husband state is normal per patient.  Psychiatric:        Behavior: Behavior normal.    ED Treatments / Results  Labs (all labs ordered are listed, but only abnormal results are displayed) Labs Reviewed  PREGNANCY, URINE    EKG None  Radiology Dg Chest Portable 1 View  Result Date: 10/18/2018 CLINICAL DATA:  Shortness of breath and cough for 2 weeks. EXAM: PORTABLE CHEST 1 VIEW COMPARISON:  07/15/2018. FINDINGS: The heart size and mediastinal contours are within normal limits. Both lungs are clear. The visualized skeletal structures  are unremarkable. IMPRESSION: No active disease. Electronically Signed   By: Elsie Stain M.D.   On: 10/18/2018 14:57   Dg Knee Left Port  Result Date: 10/18/2018 CLINICAL DATA:  Left knee pain since fall a week ago. EXAM: PORTABLE LEFT KNEE - 1-2 VIEW COMPARISON:  None. FINDINGS: No evidence of fracture, dislocation, or joint effusion. No evidence of arthropathy or other focal bone abnormality. Soft tissues are unremarkable. IMPRESSION: Negative. Electronically Signed   By: Obie Dredge M.D.   On: 10/18/2018 16:45    Procedures Procedures (including critical care time)  Medications Ordered in ED Medications - No data to display   Initial Impression / Assessment and Plan / ED Course  I have reviewed the triage vital signs and the nursing notes.  Pertinent labs & imaging results that were available during my care of the patient were reviewed by me and considered in my medical decision making (see chart for details).    Ysabella Babiarz was evaluated in Emergency Department on 10/18/2018 for the symptoms described in the history of present illness. She was evaluated in the context of the global COVID-19 pandemic, which necessitated consideration that the patient might be at risk for infection with the SARS-CoV-2 virus that causes COVID-19. Institutional protocols and algorithms that pertain to the evaluation of patients at risk for COVID-19 are in a state of rapid change based on information released by regulatory bodies including the CDC and federal and state organizations. These policies and algorithms were followed during the patient's care in the ED. Patient presents with 7 days of symptoms, no shortness of breath, no fever in the past 4 days.  No recent travel or known positive coronavirus contacts.  Per current guidelines testing for novel coronavirus is not indicated at this time.  Chest x-ray negative.  Patient afebrile, not tachycardic and with no hypoxia on room air.  Appears safe for  outpatient treatment of her viral illness.  Discussed increasing water intake, rest and OTC Tylenol for symptomatic relief.  Additionally patient with left knee pain following fall that  occurred 2 days ago.  No history of head injury, loss of consciousness, neck pain/back pain, headache or blood thinner use.  No sign of injury to the head neck or back.  No pain to the chest or abdomen.  Hips stable to compression bilaterally.  Patient moving all 4 extremities without pain or difficulty.  Patient is ambulatory without assistance or difficulty.  Physical examination with small area of ecchymosis to the anterior knee no pain with flexion or extension patient requested imaging today to evaluate for fracture.  Patient is neurovascular intact to bilateral lower extremities no signs of cellulitis, septic arthritis, DVT or compartment syndrome.  DG left knee negative.  Patient given knee sleeve, instructed on rice protocol and given sports medicine follow-up. - Additionally upon discharge it came to light that patient with one episode of lightheadedness upon standing here in the emergency department, blood pressure of 85/54, subsequently improved.  Orthostatics were obtained and patient was without dizziness or lightheadedness when orthostatics.  I offered patient a IV fluid bolus for likely dehydration due to decreased p.o. intake with her viral illness however she refuses today and has requested to be discharged at this time and pursue p.o. water rehydration.,  Discussed with Dr. Adela Lank, feel this is a reasonable plan of care today patient is to be discharged.  At discharge patient afebrile, not tachycardic, not hypotensive and with SPO2 100% on room air.  She is overall very well-appearing and in no acute distress.  At this time there does not appear to be any evidence of an acute emergency medical condition and the patient appears stable for discharge with appropriate outpatient follow up. Diagnosis was discussed  with patient who verbalizes understanding of care plan and is agreeable to discharge. I have discussed return precautions with patient and husband who verbalize understanding of return precautions. Patient encouraged to follow-up with their PCP. All questions answered.  Patient's case discussed with Dr. Adela Lank who agrees with plan to discharge with follow-up.   Note: Portions of this report may have been transcribed using voice recognition software. Every effort was made to ensure accuracy; however, inadvertent computerized transcription errors may still be present. Final Clinical Impressions(s) / ED Diagnoses   Final diagnoses:  Viral URI with cough  Acute pain of left knee    ED Discharge Orders         Ordered    benzonatate (TESSALON) 100 MG capsule  Every 8 hours     10/18/18 1708           Elizabeth Palau 10/18/18 1812    Melene Plan, DO 10/18/18 1911

## 2018-10-18 NOTE — ED Triage Notes (Signed)
Pt states fever, cough for 2 weeks. States short of breath , no sob noted in triage. Afebirle. No cough noted in triage.

## 2018-10-18 NOTE — Discharge Instructions (Addendum)
You have been diagnosed today with viral upper respiratory tract infection with cough and left knee pain.  At this time there does not appear to be the presence of an emergent medical condition, however there is always the potential for conditions to change. Please read and follow the below instructions.  Please return to the Emergency Department immediately for any new or worsening symptoms. Please be sure to follow up with your Primary Care Provider within one week regarding your visit today; please call their office to schedule an appointment even if you are feeling better for a follow-up visit. Your knee x-ray today did not show any fractures however ligament and tendon injury or small unseen fractures may still be present.  I recommend that you follow-up with an orthopedic doctor regarding your knee pain.  You may follow-up with the sports medicine doctor, Dr. Pearletha Forge for further evaluation of your left knee pain.  You may use the knee sleeve provided today to help with your pain, rest ice and elevation will also be helpful for your symptoms. As to your viral illness as we discussed testing for the novel coronavirus is not indicated at this time per current guidelines.  Please follow-up with your primary care provider within the next 5 days.  Please drink plenty of water and get plenty of rest to help with your symptoms.  You may use Tylenol as directed on the packaging to help with your symptoms.  Return immediately to the emergency department for any new/concerning or worsening symptoms including shortness of breath. You may use the medication Tessalon as prescribed to help with your cough.  Get help right away if: Your knee swells, and the swelling gets worse. You cannot move your knee. You have very bad knee pain. Get help right away if: You feel pain or pressure in your chest. You have shortness of breath. You faint or feel like you will faint. You keep throwing up (vomiting). You feel  confused. Any new or concerning symptoms.  Please read the additional information packets attached to your discharge summary.  Do not take your medicine if  develop an itchy rash, swelling in your mouth or lips, or difficulty breathing.  ===================  If you live with, or provide care at home for, a person confirmed to have, or being evaluated for, COVID-19 infection please follow these guidelines to prevent infection:  Follow healthcare providers instructions Make sure that you understand and can help the patient follow any healthcare provider instructions for all care.  Provide for the patients basic needs You should help the patient with basic needs in the home and provide support for getting groceries, prescriptions, and other personal needs.  Monitor the patients symptoms If they are getting sicker, call his or her medical provider a  This will help the healthcare providers office take steps to keep other people from getting infected. Ask the healthcare provider to call the local or state health department.  Limit the number of people who have contact with the patient If possible, have only one caregiver for the patient. Other household members should stay in another home or place of residence. If this is not possible, they should stay in another room, or be separated from the patient as much as possible. Use a separate bathroom, if available. Restrict visitors who do not have an essential need to be in the home.  Keep older adults, very young children, and other sick people away from the patient Keep older adults, very young children, and  those who have compromised immune systems or chronic health conditions away from the patient. This includes people with chronic heart, lung, or kidney conditions, diabetes, and cancer.  Ensure good ventilation Make sure that shared spaces in the home have good air flow, such as from an air conditioner or an opened window, weather  permitting.  Wash your hands often Wash your hands often and thoroughly with soap and water for at least 20 seconds. You can use an alcohol based hand sanitizer if soap and water are not available and if your hands are not visibly dirty. Avoid touching your eyes, nose, and mouth with unwashed hands. Use disposable paper towels to dry your hands. If not available, use dedicated cloth towels and replace them when they become wet.  Wear a facemask and gloves Wear a disposable facemask at all times in the room and gloves when you touch or have contact with the patients blood, body fluids, and/or secretions or excretions, such as sweat, saliva, sputum, nasal mucus, vomit, urine, or feces.  Ensure the mask fits over your nose and mouth tightly, and do not touch it during use. Throw out disposable facemasks and gloves after using them. Do not reuse. Wash your hands immediately after removing your facemask and gloves. If your personal clothing becomes contaminated, carefully remove clothing and launder. Wash your hands after handling contaminated clothing. Place all used disposable facemasks, gloves, and other waste in a lined container before disposing them with other household waste. Remove gloves and wash your hands immediately after handling these items.  Do not share dishes, glasses, or other household items with the patient Avoid sharing household items. You should not share dishes, drinking glasses, cups, eating utensils, towels, bedding, or other items After the person uses these items, you should wash them thoroughly with soap and water.  Wash laundry thoroughly Immediately remove and wash clothes or bedding that have blood, body fluids, and/or secretions or excretions, such as sweat, saliva, sputum, nasal mucus, vomit, urine, or feces, on them. Wear gloves when handling laundry from the patient. Read and follow directions on labels of laundry or clothing items and detergent. In general, wash  and dry with the warmest temperatures recommended on the label.  Clean all areas the individual has used often Clean all touchable surfaces, such as counters, tabletops, doorknobs, bathroom fixtures, toilets, phones, keyboards, tablets, and bedside tables, every day. Also, clean any surfaces that may have blood, body fluids, and/or secretions or excretions on them. Wear gloves when cleaning surfaces the patient has come in contact with. Use a diluted bleach solution (e.g., dilute bleach with 1 part bleach and 10 parts water) or a household disinfectant with a label that says EPA-registered for coronaviruses. To make a bleach solution at home, add 1 tablespoon of bleach to 1 quart (4 cups) of water. For a larger supply, add  cup of bleach to 1 gallon (16 cups) of water. Read labels of cleaning products and follow recommendations provided on product labels. Labels contain instructions for safe and effective use of the cleaning product including precautions you should take when applying the product, such as wearing gloves or eye protection and making sure you have good ventilation during use of the product. Remove gloves and wash hands immediately after cleaning.  Monitor yourself for signs and symptoms of illness Caregivers and household members are considered close contacts, should monitor their health, and will be asked to limit movement outside of the home to the extent possible. Follow the monitoring steps  for close contacts listed on the symptom monitoring form.   ? If you have additional questions, contact your local health department or call the epidemiologist on call at [phone] (available 24/7). ? This guidance is subject to change. For the most up-to-date guidance from [facility], please refer to their website: [email or web address]

## 2018-11-14 ENCOUNTER — Emergency Department (HOSPITAL_BASED_OUTPATIENT_CLINIC_OR_DEPARTMENT_OTHER)
Admission: EM | Admit: 2018-11-14 | Discharge: 2018-11-14 | Disposition: A | Discharge status: Home / Self Care | Payer: Medicare Other | Attending: Emergency Medicine | Admitting: Emergency Medicine

## 2018-11-14 ENCOUNTER — Encounter (HOSPITAL_BASED_OUTPATIENT_CLINIC_OR_DEPARTMENT_OTHER): Payer: Self-pay | Admitting: Emergency Medicine

## 2018-11-14 ENCOUNTER — Ambulatory Visit: Payer: Medicare Other | Admitting: Obstetrics & Gynecology

## 2018-11-14 ENCOUNTER — Other Ambulatory Visit: Payer: Self-pay

## 2018-11-14 DIAGNOSIS — Z79899 Other long term (current) drug therapy: Secondary | ICD-10-CM | POA: Diagnosis not present

## 2018-11-14 DIAGNOSIS — E876 Hypokalemia: Secondary | ICD-10-CM | POA: Insufficient documentation

## 2018-11-14 DIAGNOSIS — N939 Abnormal uterine and vaginal bleeding, unspecified: Secondary | ICD-10-CM | POA: Insufficient documentation

## 2018-11-14 DIAGNOSIS — E860 Dehydration: Secondary | ICD-10-CM | POA: Diagnosis not present

## 2018-11-14 DIAGNOSIS — R112 Nausea with vomiting, unspecified: Secondary | ICD-10-CM | POA: Diagnosis not present

## 2018-11-14 LAB — URINALYSIS, ROUTINE W REFLEX MICROSCOPIC
Bilirubin Urine: NEGATIVE
Glucose, UA: NEGATIVE mg/dL
Ketones, ur: NEGATIVE mg/dL
Nitrite: NEGATIVE
Protein, ur: NEGATIVE mg/dL
Specific Gravity, Urine: <1.005 — ABNORMAL LOW (ref 1.005–1.030)
pH: 6.5 (ref 5.0–8.0)

## 2018-11-14 LAB — COMPREHENSIVE METABOLIC PANEL
ALT: 15 U/L (ref 0–44)
AST: 18 U/L (ref 15–41)
Albumin: 3.7 g/dL (ref 3.5–5.0)
Alkaline Phosphatase: 57 U/L (ref 38–126)
Anion gap: 9 (ref 5–15)
BUN: 14 mg/dL (ref 6–20)
CO2: 27 mmol/L (ref 22–32)
Calcium: 8.8 mg/dL — ABNORMAL LOW (ref 8.9–10.3)
Chloride: 99 mmol/L (ref 98–111)
Creatinine, Ser: 0.85 mg/dL (ref 0.44–1.00)
GFR calc Af Amer: >60 mL/min (ref >60)
GFR calc non Af Amer: >60 mL/min (ref >60)
Glucose, Bld: 106 mg/dL — ABNORMAL HIGH (ref 70–99)
Potassium: 3.3 mmol/L — ABNORMAL LOW (ref 3.5–5.1)
Sodium: 135 mmol/L (ref 135–145)
Total Bilirubin: 0.4 mg/dL (ref 0.3–1.2)
Total Protein: 7.1 g/dL (ref 6.5–8.1)

## 2018-11-14 LAB — CBC WITH DIFFERENTIAL/PLATELET
Abs Immature Granulocytes: 0.05 10*3/uL (ref 0.00–0.07)
Basophils Absolute: 0.1 10*3/uL (ref 0.0–0.1)
Basophils Relative: 1 %
Eosinophils Absolute: 0.2 10*3/uL (ref 0.0–0.5)
Eosinophils Relative: 2 %
HCT: 40 % (ref 36.0–46.0)
Hemoglobin: 13.2 g/dL (ref 12.0–15.0)
Immature Granulocytes: 1 %
Lymphocytes Relative: 23 %
Lymphs Abs: 2.5 10*3/uL (ref 0.7–4.0)
MCH: 28.3 pg (ref 26.0–34.0)
MCHC: 33 g/dL (ref 30.0–36.0)
MCV: 85.8 fL (ref 80.0–100.0)
Monocytes Absolute: 1 10*3/uL (ref 0.1–1.0)
Monocytes Relative: 10 %
Neutro Abs: 7 10*3/uL (ref 1.7–7.7)
Neutrophils Relative %: 63 %
Platelets: 379 10*3/uL (ref 150–400)
RBC: 4.66 MIL/uL (ref 3.87–5.11)
RDW: 12.1 % (ref 11.5–15.5)
WBC: 10.8 10*3/uL — ABNORMAL HIGH (ref 4.0–10.5)
nRBC: 0 % (ref 0.0–0.2)

## 2018-11-14 LAB — URINALYSIS, MICROSCOPIC (REFLEX)

## 2018-11-14 LAB — LIPASE, BLOOD: Lipase: 49 U/L (ref 11–51)

## 2018-11-14 LAB — PREGNANCY, URINE: Preg Test, Ur: NEGATIVE

## 2018-11-14 MED ORDER — ONDANSETRON 4 MG PO TBDP: 4.0000 mg | ORAL_TABLET | Freq: Three times a day (TID) | ORAL | 0 refills | Status: DC | PRN

## 2018-11-14 MED ORDER — POTASSIUM CHLORIDE CRYS ER 20 MEQ PO TBCR
40.0000 meq | EXTENDED_RELEASE_TABLET | Freq: Once | ORAL | Status: AC
  Administered 2018-11-14: 40 meq via ORAL
  Filled 2018-11-14: qty 2

## 2018-11-14 MED ORDER — SODIUM CHLORIDE 0.9 % IV BOLUS
1000.0000 mL | Freq: Once | INTRAVENOUS | Status: AC
  Administered 2018-11-14: 1000 mL via INTRAVENOUS

## 2018-11-14 MED ORDER — ONDANSETRON HCL 4 MG/2ML IJ SOLN
4.0000 mg | Freq: Once | INTRAMUSCULAR | Status: AC
  Administered 2018-11-14: 4 mg via INTRAVENOUS
  Filled 2018-11-14: qty 2

## 2018-11-14 NOTE — Discharge Instructions (Signed)
Take Zofran as needed as prescribed for nausea and vomiting. Your potassium was slightly low today and you were given a potassium pill. Follow up with GYN for vaginal bleeding.  -[facility]- office in this building   [address]   [phone]

## 2018-11-14 NOTE — ED Provider Notes (Signed)
[facility] EMERGENCY DEPARTMENT Provider Note   CSN: [id number] Arrival date & time: 11/14/18  1536    History   Chief Complaint Chief Complaint  Patient presents with  . Emesis  . Vaginal Bleeding    HPI Barbara Gallegos is a 48 y.o. female.     48yo female with PMH of Huntington's disease presents with complaint of nausea and vomiting x 5 days. Patient went to [facility] and was sent to the ER for evaluation of vomiting, also complaint of vaginal bleeding. Patient reports daily bleeding x 1 month, using 2-3 pads/tampons daily. Patient reports generalized body aches, denies feeling weak or dizzy. Denies abdominal pain, fevers, chills, cough, shortness of breath, changes in bowel or bladder habits. Patient has been told previously she is anemic, has never required a blood transfusion. Patient recently moved to the area and does not have a local PCP.     Past Medical History:  Diagnosis Date  . Huntington disease (HCC)     There are no active problems to display for this patient.   Past Surgical History:  Procedure Laterality Date  . HAND SURGERY       OB History   No obstetric history on file.      Home Medications    Prior to Admission medications   Medication Sig Start Date End Date Taking? Authorizing Provider  benzonatate (TESSALON) 100 MG capsule Take 1 capsule (100 mg total) by mouth every 8 (eight) hours. 10/18/18   Harlene SaltsMorelli, Brandon A, PA-C  cephALEXin (KEFLEX) 500 MG capsule Take 1 capsule (500 mg total) by mouth 2 (two) times daily. 07/15/18   Tilden Fossaees, Elizabeth, MD  Clindamycin-Benzoyl Per, Refr, gel APP TO AFFECTED AREAS OF SKIN BID 12/13/17   [provider]  ondansetron (ZOFRAN ODT) 4 MG disintegrating tablet Take 1 tablet (4 mg total) by mouth every 8 (eight) hours as needed for nausea or vomiting. 11/14/18   Jeannie FendMurphy, Tinita Brooker A, PA-C  sertraline (ZOLOFT) 50 MG tablet TK 1 T PO IN THE MORNING 12/30/17   [provider]   traZODone (DESYREL) 50 MG tablet TK 1 T PO NIGHTLY 10/15/17   [provider]    Family History No family history on file.  Social History Social History   Tobacco Use  . Smoking status: Never Smoker  . Smokeless tobacco: Never Used  Substance Use Topics  . Alcohol use: Never    Frequency: Never  . Drug use: Never     Allergies   Patient has no known allergies.   Review of Systems Review of Systems  Constitutional: Negative for chills, diaphoresis and fever.  Respiratory: Negative for cough and shortness of breath.   Cardiovascular: Negative for chest pain.  Gastrointestinal: Positive for nausea and vomiting. Negative for abdominal distention, abdominal pain, constipation and diarrhea.  Genitourinary: Positive for vaginal bleeding. Negative for dysuria, frequency and urgency.  Musculoskeletal: Positive for arthralgias and myalgias.  Skin: Negative for rash and wound.  Allergic/Immunologic: Negative for immunocompromised state.  Neurological: Negative for dizziness and weakness.  Hematological: Does not bruise/bleed easily.  Psychiatric/Behavioral: Negative for confusion.  All other systems reviewed and are negative.    Physical Exam Updated Vital Signs BP (!) 143/53 (BP Location: Right Arm)   Pulse 88   Temp 99.2 F (37.3 C) (Oral)   Resp 16   Ht 5\' 5"  (1.651 m)   Wt 77.1 kg   SpO2 99%   BMI 28.29 kg/m   Physical Exam Vitals signs  and nursing note reviewed.  Constitutional:      General: She is not in acute distress.    Appearance: She is well-developed. She is not diaphoretic.  HENT:     Head: Normocephalic and atraumatic.     Nose: Nose normal.     Mouth/Throat:     Mouth: Mucous membranes are moist.  Eyes:     Conjunctiva/sclera: Conjunctivae normal.  Cardiovascular:     Rate and Rhythm: Normal rate and regular rhythm.     Pulses: Normal pulses.     Heart sounds: Normal heart sounds.  Pulmonary:     Effort: Pulmonary effort is normal.      Breath sounds: Normal breath sounds.  Abdominal:     General: Abdomen is flat.     Tenderness: There is no abdominal tenderness.  Skin:    General: Skin is warm and dry.     Findings: No erythema or rash.  Neurological:     Mental Status: She is alert and oriented to person, place, and time.  Psychiatric:        Behavior: Behavior normal.      ED Treatments / Results  Labs (all labs ordered are listed, but only abnormal results are displayed) Labs Reviewed  COMPREHENSIVE METABOLIC PANEL - Abnormal; Notable for the following components:      Result Value   Potassium 3.3 (*)    Glucose, Bld 106 (*)    Calcium 8.8 (*)    All other components within normal limits  CBC WITH DIFFERENTIAL/PLATELET - Abnormal; Notable for the following components:   WBC 10.8 (*)    All other components within normal limits  URINALYSIS, ROUTINE W REFLEX MICROSCOPIC - Abnormal; Notable for the following components:   Specific Gravity, Urine <1.005 (*)    Hgb urine dipstick TRACE (*)    Leukocytes,Ua TRACE (*)    All other components within normal limits  URINALYSIS, MICROSCOPIC (REFLEX) - Abnormal; Notable for the following components:   Bacteria, UA FEW (*)    All other components within normal limits  LIPASE, BLOOD  PREGNANCY, URINE    EKG None  Radiology No results found.  Procedures Procedures (including critical care time)  Medications Ordered in ED Medications  sodium chloride 0.9 % bolus 1,000 mL ( Intravenous Stopped 11/14/18 1736)  ondansetron (ZOFRAN) injection 4 mg (4 mg Intravenous Given 11/14/18 1625)  potassium chloride SA (K-DUR) CR tablet 40 mEq (40 mEq Oral Given 11/14/18 1737)     Initial Impression / Assessment and Plan / ED Course  I have reviewed the triage vital signs and the nursing notes.  Pertinent labs & imaging results that were available during my care of the patient were reviewed by me and considered in my medical decision making (see chart for details).   Clinical Course as of Nov 14 2002  Tue Nov 14, 2018  2002 47yo female with complaint of nausea and vomiting x 4 days, no abdominal pain, fevers, changes in bowel or bladder habits. Also vaginal bleeding x 1 month. Abdomen soft and non tender, patient well appearing. Labs reassuring, not anemic, mild hypokalemia (orally repleted), feels better after zofran and fluids and tolerating Pos. Patient is not pregnant. Referred to local GYN for follow up for vaginal bleeding, local PCP to establish care, return to ER for fevers, abdominal pain, worsening bleeding or other concerning symptoms.    [LM]    Clinical Course User Index [LM] Jeannie Fend, PA-C      Final Clinical  Impressions(s) / ED Diagnoses   Final diagnoses:  Non-intractable vomiting with nausea, unspecified vomiting type  Hypokalemia  Vaginal bleeding    ED Discharge Orders         Ordered    ondansetron (ZOFRAN ODT) 4 MG disintegrating tablet  Every 8 hours PRN     11/14/18 1818           Jeannie Fend, PA-C 11/14/18 2004    Melene Plan, DO 11/14/18 2158

## 2018-11-14 NOTE — ED Triage Notes (Signed)
[facility] sent [facility] for vomiting for 1 since 4/23 and vaginal bleeding for the last month.

## 2018-11-14 NOTE — ED Notes (Signed)
No active emesis 

## 2018-11-14 NOTE — ED Notes (Signed)
Up to [facility].

## 2018-11-23 ENCOUNTER — Other Ambulatory Visit: Payer: Self-pay

## 2018-11-23 ENCOUNTER — Encounter: Payer: Self-pay | Admitting: Obstetrics & Gynecology

## 2018-11-23 ENCOUNTER — Ambulatory Visit: Payer: Medicare Other | Admitting: Obstetrics & Gynecology

## 2018-11-23 VITALS — BP 124/78 | Ht 65.0 in | Wt 176.0 lb

## 2018-11-23 DIAGNOSIS — Z3009 Encounter for other general counseling and advice on contraception: Secondary | ICD-10-CM | POA: Diagnosis not present

## 2018-11-23 DIAGNOSIS — G10 Huntington's disease: Secondary | ICD-10-CM | POA: Diagnosis not present

## 2018-11-23 DIAGNOSIS — N921 Excessive and frequent menstruation with irregular cycle: Secondary | ICD-10-CM | POA: Diagnosis not present

## 2018-11-23 DIAGNOSIS — Z01419 Encounter for gynecological examination (general) (routine) without abnormal findings: Secondary | ICD-10-CM

## 2018-11-23 DIAGNOSIS — Z1151 Encounter for screening for human papillomavirus (HPV): Secondary | ICD-10-CM | POA: Diagnosis not present

## 2018-11-23 NOTE — Progress Notes (Signed)
Barbara Gallegos 04-02-1971 [id number]   History:    48 y.o. G2P2L2.  Married x 1 year.  RP:  New patient presenting for annual gyn exam   HPI: Menses usually regular every month with normal flow, but has been bleeding most days x 5 weeks now.  Started as postcoital bleeding and bleeds a little more after IC since then.  Presented at Emergency facitiy 11/14/2018.  CBC Hb 13.2.  U/A wnl.  UPT negative.  No pelvic pain.  No abnormal discharge otherwise.  No pain with IC.  No contraception.  Urine/BMs normal.  Breasts normal.  BMI 29.29.  Huntington's disease with frequent falls.    Past medical history,surgical history, family history and social history were all reviewed and documented in the EPIC chart.  Gynecologic History No LMP recorded. Contraception: none Last Pap: 3 yrs ago, normal per patient Last mammogram: 3 yrs ago, normal per patient Bone Density: Never Colonoscopy: Never  Obstetric History OB History  Gravida Para Term Preterm AB Living  2 2       2   SAB TAB Ectopic Multiple Live Births               # Outcome Date GA Lbr Len/2nd Weight Sex Delivery Anes PTL Lv  2 Para           1 Para              ROS: A ROS was performed and pertinent positives and negatives are included in the history.  GENERAL: No fevers or chills. HEENT: No change in vision, no earache, sore throat or sinus congestion. NECK: No pain or stiffness. CARDIOVASCULAR: No chest pain or pressure. No palpitations. PULMONARY: No shortness of breath, cough or wheeze. GASTROINTESTINAL: No abdominal pain, nausea, vomiting or diarrhea, melena or bright red blood per rectum. GENITOURINARY: No urinary frequency, urgency, hesitancy or dysuria. MUSCULOSKELETAL: No joint or muscle pain, no back pain, no recent trauma. DERMATOLOGIC: No rash, no itching, no lesions. ENDOCRINE: No polyuria, polydipsia, no heat or cold intolerance. No recent change in weight. HEMATOLOGICAL: No anemia or easy bruising or bleeding.  NEUROLOGIC: No headache, seizures, numbness, tingling or weakness. PSYCHIATRIC: No depression, no loss of interest in normal activity or change in sleep pattern.     Exam:   Ht 5\' 5"  (1.651 m)   Wt 176 lb (79.8 kg)   BMI 29.29 kg/m   Body mass index is 29.29 kg/m.  General appearance : Well developed well nourished female. No acute distress HEENT: Eyes: no retinal hemorrhage or exudates,  Neck supple, trachea midline, no carotid bruits, no thyroidmegaly Lungs: Clear to auscultation, no rhonchi or wheezes, or rib retractions  Heart: Regular rate and rhythm, no murmurs or gallops Breast:Examined in sitting and supine position were symmetrical in appearance, no palpable masses or tenderness,  no skin retraction, no nipple inversion, no nipple discharge, no skin discoloration, no axillary or supraclavicular lymphadenopathy Abdomen: no palpable masses or tenderness, no rebound or guarding Extremities: no edema or skin discoloration or tenderness  Pelvic: Vulva: Normal             Vagina: No gross lesions or discharge  Cervix: No gross lesions or discharge.  Pap/HPV HR done.  Uterus AV, normal size, shape and consistency, non-tender and mobile  Adnexa  Without masses or tenderness  Anus: Normal  Hemoglobin 13.2 on November 14, 2018   Assessment/Plan:  48 y.o. female for annual exam   1. Encounter for routine gynecological examination  with Papanicolaou smear of cervix Normal gynecologic exam.  Pap with high-risk HPV done today.  Breast exam normal.  Schedule screening mammogram now.  2. Encounter for other general counseling or advice on contraception Declines contraception at this time, okay if conceives.  May start on birth control pills though at follow-up based on results to control the menstrual cycle.  3. Metrorrhagia Mild metrorrhagia for 5 weeks without secondary anemia.  Rule out hormonal dysfunction, will check TSH prolactin.  Will verify Hemoglobin A1c today as part of  work-up for PCOS.  Follow-up pelvic ultrasound to rule out endometrial pathology including polyps, fibroids, endometrial hyperplasia and endometrial cancer. - TSH - Prolactin - Hemoglobin A1C - US Transvaginal Non-OB; Future  4. Huntington disease (HCC)  Counseling on above issues and coordination of care more than 50% for 10 minutes.  Genia Del MD, 12:14 PM 11/23/2018

## 2018-11-24 ENCOUNTER — Encounter: Payer: Self-pay | Admitting: Obstetrics & Gynecology

## 2018-11-24 LAB — PAP, TP IMAGING W/ HPV RNA, RFLX HPV TYPE 16,18/45: HPV DNA High Risk: DETECTED — AB

## 2018-11-24 LAB — TSH: TSH: 0.89 mIU/L

## 2018-11-24 LAB — PROLACTIN: Prolactin: 7.1 ng/mL

## 2018-11-24 LAB — HEMOGLOBIN A1C
Hgb A1c MFr Bld: 5.9 % of total Hgb — ABNORMAL HIGH (ref <5.7)
Mean Plasma Glucose: 123 (calc)
eAG (mmol/L): 6.8 (calc)

## 2018-11-24 NOTE — Patient Instructions (Signed)
1. Encounter for routine gynecological examination with Papanicolaou smear of cervix Normal gynecologic exam.  Pap with high-risk HPV done today.  Breast exam normal.  Schedule screening mammogram now.  2. Encounter for other general counseling or advice on contraception Declines contraception at this time, okay if conceives.  May start on birth control pills though at follow-up based on results to control the menstrual cycle.  3. Metrorrhagia Mild metrorrhagia for 5 weeks without secondary anemia.  Rule out hormonal dysfunction, will check TSH prolactin.  Will verify Hemoglobin A1c today as part of work-up for PCOS.  Follow-up pelvic ultrasound to rule out endometrial pathology including polyps, fibroids, endometrial hyperplasia and endometrial cancer. - TSH - Prolactin - Hemoglobin A1C - US Transvaginal Non-OB; Future  4. Huntington disease (HCC)  Barbara Gallegos, it was a pleasure meeting you today!  I will inform you of your results as soon as they are available.

## 2018-11-27 ENCOUNTER — Other Ambulatory Visit: Payer: Self-pay | Admitting: Anesthesiology

## 2018-11-27 DIAGNOSIS — N921 Excessive and frequent menstruation with irregular cycle: Secondary | ICD-10-CM

## 2018-12-05 ENCOUNTER — Other Ambulatory Visit: Payer: Self-pay

## 2018-12-07 ENCOUNTER — Ambulatory Visit: Payer: Medicare Other | Admitting: Obstetrics & Gynecology

## 2018-12-07 ENCOUNTER — Other Ambulatory Visit: Payer: Medicare Other

## 2018-12-14 ENCOUNTER — Other Ambulatory Visit: Payer: Medicare Other

## 2018-12-20 ENCOUNTER — Other Ambulatory Visit: Payer: Self-pay

## 2018-12-21 ENCOUNTER — Encounter: Payer: Self-pay | Admitting: Obstetrics & Gynecology

## 2018-12-21 ENCOUNTER — Ambulatory Visit (INDEPENDENT_AMBULATORY_CARE_PROVIDER_SITE_OTHER): Payer: Medicare Other | Admitting: Obstetrics & Gynecology

## 2018-12-21 ENCOUNTER — Ambulatory Visit (INDEPENDENT_AMBULATORY_CARE_PROVIDER_SITE_OTHER): Payer: Medicare Other

## 2018-12-21 DIAGNOSIS — Z30011 Encounter for initial prescription of contraceptive pills: Secondary | ICD-10-CM | POA: Diagnosis not present

## 2018-12-21 DIAGNOSIS — N926 Irregular menstruation, unspecified: Secondary | ICD-10-CM

## 2018-12-21 DIAGNOSIS — N921 Excessive and frequent menstruation with irregular cycle: Secondary | ICD-10-CM | POA: Diagnosis not present

## 2018-12-21 MED ORDER — NORETHIN ACE-ETH ESTRAD-FE 1-20 MG-MCG(24) PO TABS: 1.0000 | ORAL_TABLET | Freq: Every day | ORAL | 4 refills | Status: DC

## 2018-12-21 NOTE — Progress Notes (Signed)
    Barbara Gallegos [birth year] [id number]        48 y.o.  G2P2L2  Married x 1 year.  RP: Menometrorrhagia for Pelvic US  HPI: Huntington's disease.  Menmetrorrhagia and postcoital bleeding. No pelvic pain.  LMP 2 weeks ago.  No unprotected IC x >1 month.  Used condoms 2 weeks ago.  Would like to start on contraception.   OB History  Gravida Para Term Preterm AB Living  2 2       2   SAB TAB Ectopic Multiple Live Births               # Outcome Date GA Lbr Len/2nd Weight Sex Delivery Anes PTL Lv  2 Para           1 Para             Past medical history,surgical history, problem list, medications, allergies, family history and social history were all reviewed and documented in the EPIC chart.   Directed ROS with pertinent positives and negatives documented in the history of present illness/assessment and plan.  Exam:  There were no vitals filed for this visit. General appearance:  Normal  Pelvic US today: T/V images.  Anteverted uterus measuring 7.54 x 6.14 x 5.16 cm, normal in size and shape with no mass.  Symmetrical Secretary endometrium, endometrial lining measured at 6.4 mm with no mass or thickening seen.  Both ovaries are normal in size with sparse small follicles.  No adnexal mass.  No free fluid in the posterior cul-de-sac.  Lab work Nov 23, 2018: Prolactin normal at 7.1, hemoglobin A1c slightly increased at 5.9, TSH normal at 0.89.   Assessment/Plan:  49 y.o. G2P2   1. Irregular menses Menometrorrhagia with postcoital bleeding.  Pelvic ultrasound today reveals no abnormality.  Findings reviewed with patient including a normal uterus in size and appearance with a normal endometrial lining at 6.4 mm with no mass or thickening, normal ovaries and no free fluid in the posterior cul-de-sac.  Prolactin and TSH were normal and hemoglobin A1c was just slightly increased at 5.9.  Probably dysfunctional bleeding with oligo ovulation at age 64.  Patient would like to be on birth  control pills to control her cycle and to have better contraception.  2. Encounter for initial prescription of contraceptive pills Would like to start on birth control pills.  No contraindication.  Usage, risks and benefits reviewed.  The generic of Loestrin 24 Fe 1/20 prescribed.  Other orders - Norethindrone Acetate-Ethinyl Estrad-FE (LOESTRIN 24 FE) 1-20 MG-MCG(24) tablet; Take 1 tablet by mouth daily.  Counseling on above issues and coordination of care more than 50% for 15 minutes.  Genia Del MD, 12:45 PM 12/21/2018

## 2018-12-21 NOTE — Patient Instructions (Signed)
  1. Irregular menses Menometrorrhagia with postcoital bleeding.  Pelvic ultrasound today reveals no abnormality.  Findings reviewed with patient including a normal uterus in size and appearance with a normal endometrial lining at 6.4 mm with no mass or thickening, normal ovaries and no free fluid in the posterior cul-de-sac.  Prolactin and TSH were normal and hemoglobin A1c was just slightly increased at 5.9.  Probably dysfunctional bleeding with oligo ovulation at age 48.  Patient would like to be on birth control pills to control her cycle and to have better contraception.  2. Encounter for initial prescription of contraceptive pills Would like to start on birth control pills.  No contraindication.  Usage, risks and benefits reviewed.  The generic of Loestrin 24 Fe 1/20 prescribed.  Other orders - Norethindrone Acetate-Ethinyl Estrad-FE (LOESTRIN 24 FE) 1-20 MG-MCG(24) tablet; Take 1 tablet by mouth daily.  Barbara Gallegos, it was a pleasure seeing you today!

## 2019-01-24 ENCOUNTER — Encounter: Payer: Self-pay | Admitting: Obstetrics & Gynecology

## 2019-01-24 ENCOUNTER — Ambulatory Visit: Payer: Medicare Other | Admitting: Obstetrics & Gynecology

## 2019-01-24 ENCOUNTER — Other Ambulatory Visit: Payer: Self-pay

## 2019-01-24 VITALS — BP 142/80

## 2019-01-24 DIAGNOSIS — R87612 Low grade squamous intraepithelial lesion on cytologic smear of cervix (LGSIL): Secondary | ICD-10-CM

## 2019-01-24 DIAGNOSIS — N870 Mild cervical dysplasia: Secondary | ICD-10-CM | POA: Diagnosis not present

## 2019-01-24 DIAGNOSIS — R87810 Cervical high risk human papillomavirus (HPV) DNA test positive: Secondary | ICD-10-CM

## 2019-01-24 NOTE — Progress Notes (Signed)
    Barbara Gallegos 12/28/1970 [id number]        48 y.o.  G2P2L2 Married  RP: LGSIL/HPV HR positive for Colposcopy  HPI: Previous Pap tests normal.     OB History  Gravida Para Term Preterm AB Living  2 2       2   SAB TAB Ectopic Multiple Live Births               # Outcome Date GA Lbr Len/2nd Weight Sex Delivery Anes PTL Lv  2 Para           1 Para             Past medical history,surgical history, problem list, medications, allergies, family history and social history were all reviewed and documented in the EPIC chart.   Directed ROS with pertinent positives and negatives documented in the history of present illness/assessment and plan.  Exam:  Vitals:   01/24/19 1435  BP: (!) 142/80   General appearance:  Normal  Colposcopy Procedure Note Barbara Gallegos 01/24/2019  Indications: LGSIL/HPV HR positive  Procedure Details  The risks and benefits of the procedure and Verbal informed consent obtained.  Speculum placed in vagina and excellent visualization of cervix achieved, cervix swabbed x 3 with acetic acid solution.  Findings:  Cervix colposcopy: Physical Exam Genitourinary:       Vaginal colposcopy: Normal  Vulvar colposcopy: Normal  Perirectal colposcopy: Grossly normal  The cervix was sprayed with Hurricane before performing the cervical biopsies.  Specimens:  HPV 16-18-45.  Cervical Bxs at 3-12-26-10 O'Clock.  Complications: None, hemostasis with Silver Nitrate . Plan: Management per results   Assessment/Plan:  48 y.o. G2P2   1. 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  Significance of abnormal Pap and high-risk HPV positive reviewed with patient.  Colposcopy procedure explained.  Colposcopy findings reviewed with patient.  Will manage per results. - HPV Type 16 and 18/45 RNA - Surgical pathology( [place]/ [facility])  2. 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   Barbara Bruins MD, 3:22 PM 01/24/2019

## 2019-01-25 LAB — HPV TYPE 16 AND 18/45 RNA
HPV Type 16 RNA: NOT DETECTED
HPV Type 18/45 RNA: NOT DETECTED

## 2019-02-01 ENCOUNTER — Encounter: Payer: Self-pay | Admitting: Obstetrics & Gynecology

## 2019-02-01 NOTE — Patient Instructions (Signed)
1. 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  Significance of abnormal Pap and high-risk HPV positive reviewed with patient.  Colposcopy procedure explained.  Colposcopy findings reviewed with patient.  Will manage per results. - HPV Type 16 and 18/45 RNA - Surgical pathology( [place]/ [facility])  2. 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  Barbara Gallegos, it was a pleasure seeing you today!  I will inform you of your results as soon as they are available.

## 2019-07-03 ENCOUNTER — Encounter: Payer: Self-pay | Admitting: Physician Assistant

## 2019-07-03 ENCOUNTER — Ambulatory Visit: Payer: Medicare Other

## 2019-07-10 ENCOUNTER — Ambulatory Visit: Payer: Medicare Other | Attending: Neurology

## 2019-07-10 ENCOUNTER — Encounter: Payer: Medicare Other | Admitting: Speech Pathology

## 2019-07-21 ENCOUNTER — Emergency Department (HOSPITAL_COMMUNITY): Payer: Medicare PPO

## 2019-07-21 ENCOUNTER — Emergency Department (HOSPITAL_COMMUNITY)
Admission: EM | Admit: 2019-07-21 | Discharge: 2019-07-22 | Disposition: A | Discharge status: Home / Self Care | Payer: Medicare PPO | Attending: Emergency Medicine | Admitting: Emergency Medicine

## 2019-07-21 ENCOUNTER — Other Ambulatory Visit: Payer: Self-pay

## 2019-07-21 ENCOUNTER — Encounter (HOSPITAL_COMMUNITY): Payer: Self-pay

## 2019-07-21 DIAGNOSIS — W010XXA Fall on same level from slipping, tripping and stumbling without subsequent striking against object, initial encounter: Secondary | ICD-10-CM | POA: Diagnosis not present

## 2019-07-21 DIAGNOSIS — Y929 Unspecified place or not applicable: Secondary | ICD-10-CM | POA: Insufficient documentation

## 2019-07-21 DIAGNOSIS — Z79899 Other long term (current) drug therapy: Secondary | ICD-10-CM | POA: Insufficient documentation

## 2019-07-21 DIAGNOSIS — Y9301 Activity, walking, marching and hiking: Secondary | ICD-10-CM | POA: Insufficient documentation

## 2019-07-21 DIAGNOSIS — S0990XA Unspecified injury of head, initial encounter: Secondary | ICD-10-CM | POA: Diagnosis present

## 2019-07-21 DIAGNOSIS — Y999 Unspecified external cause status: Secondary | ICD-10-CM | POA: Insufficient documentation

## 2019-07-21 DIAGNOSIS — S0101XA Laceration without foreign body of scalp, initial encounter: Secondary | ICD-10-CM | POA: Insufficient documentation

## 2019-07-21 MED ORDER — HYDROGEN PEROXIDE 3 % EX SOLN
CUTANEOUS | Status: AC
  Filled 2019-07-21: qty 473

## 2019-07-21 NOTE — ED Triage Notes (Signed)
Pt with a hx of Huntington's Disease reports that she tripped getting out of the car and hit her head on the concrete. Denies LOC. Denies other injury.

## 2019-07-22 MED ORDER — LORAZEPAM 1 MG PO TABS
1.0000 mg | ORAL_TABLET | Freq: Once | ORAL | Status: AC
  Administered 2019-07-22: 1 mg via ORAL
  Filled 2019-07-22: qty 1

## 2019-07-22 MED ORDER — LIDOCAINE-EPINEPHRINE (PF) 2 %-1:200000 IJ SOLN: 10.0000 mL | Freq: Once | INTRAMUSCULAR | Status: DC

## 2019-07-22 NOTE — Discharge Instructions (Addendum)
You were seen today and found to have a laceration of the scalp.  This was closed with staples.  You may wash your hair normally but try not to submerge your head.  Follow-up for suture removal in 7 to 10 days.  Your head CT was negative.  Take Tylenol or ibuprofen as needed for pain.

## 2019-07-22 NOTE — ED Provider Notes (Signed)
[facility] DEPT Provider Note   CSN: [id number] Arrival date & time: 07/21/19  1850     History Chief Complaint  Patient presents with  . Head Laceration    Barbara Gallegos is a 49 y.o. female.  HPI     This a 49 year old female with a history of Huntington's disease who presents with a head injury.  Patient reports that she tripped and fell striking her head on the sidewalk.  She did not lose consciousness.  She is not on any anticoagulants.  She reports her last tetanus shot was within the last 5 years.  Injury occurred around 6 PM.  She has not had any nausea or vomiting.  Denies neck pain or other injury.  Currently she states that she does not have much of a headache.  Past Medical History:  Diagnosis Date  . Huntington disease ([place])     There are no problems to display for this patient.   Past Surgical History:  Procedure Laterality Date  . HAND SURGERY    . HAND SURGERY       OB History    Gravida  2   Para  2   Term      Preterm      AB      Living  2     SAB      TAB      Ectopic      Multiple      Live Births              History reviewed. No pertinent family history.  Social History   Tobacco Use  . Smoking status: Never Smoker  . Smokeless tobacco: Never Used  Substance Use Topics  . Alcohol use: Never  . Drug use: Never    Home Medications Prior to Admission medications   Medication Sig Start Date End Date Taking? Authorizing Provider  Norethindrone Acetate-Ethinyl Estrad-FE (LOESTRIN 24 FE) 1-20 MG-MCG(24) tablet Take 1 tablet by mouth daily. 12/21/18   Princess Bruins, MD  sertraline (ZOLOFT) 50 MG tablet TK 1 T PO IN THE MORNING 12/30/17   [provider]    Allergies    Patient has no known allergies.  Review of Systems   Review of Systems  Gastrointestinal: Negative for nausea and vomiting.  Skin: Positive for wound.  Neurological: Negative for dizziness, syncope and  headaches.  All other systems reviewed and are negative.   Physical Exam Updated Vital Signs BP (!) 146/79 (BP Location: Left Arm)   Pulse 95   Temp 98 F (36.7 C) (Oral)   Resp 18   SpO2 99%   Physical Exam Vitals and nursing note reviewed.  Constitutional:      Appearance: She is well-developed. She is not ill-appearing.  HENT:     Head: Normocephalic.     Comments: 6 cm laceration just superior to the occiput, dried blood noted, no active bleeding, but mildly gaping Eyes:     Extraocular Movements: Extraocular movements intact.     Pupils: Pupils are equal, round, and reactive to light.  Neck:     Comments: No midline C-spine tenderness to palpation, step-off, deformity Cardiovascular:     Rate and Rhythm: Normal rate and regular rhythm.  Pulmonary:     Effort: Pulmonary effort is normal. No respiratory distress.  Musculoskeletal:        General: No deformity.     Cervical back: Neck supple.     Right lower leg: No  edema.     Left lower leg: No edema.  Skin:    General: Skin is warm and dry.  Neurological:     Mental Status: She is alert and oriented to person, place, and time.  Psychiatric:        Mood and Affect: Mood normal.     ED Results / Procedures / Treatments   Labs (all labs ordered are listed, but only abnormal results are displayed) Labs Reviewed - No data to display  EKG None  Radiology CT Head Wo Contrast  Result Date: 07/22/2019 CLINICAL DATA:  Headache. Posttraumatic. Tripped getting out of car striking head on concrete. History of Huntington's. EXAM: CT HEAD WITHOUT CONTRAST TECHNIQUE: Contiguous axial images were obtained from the base of the skull through the vertex without intravenous contrast. COMPARISON:  None. FINDINGS: Brain: No evidence of acute infarction, hemorrhage, hydrocephalus, extra-axial collection or mass lesion/mass effect. Mild generalized atrophy for age. Vascular: No hyperdense vessel or unexpected calcification. Skull: No  fracture or focal lesion. Sinuses/Orbits: Paranasal sinuses and mastoid air cells are clear. The visualized orbits are unremarkable. Other: Small vertex scalp hematoma. IMPRESSION: Small vertex scalp hematoma. No skull fracture or acute intracranial abnormality. Electronically Signed   By: Narda Rutherford M.D.   On: 07/22/2019 00:41    Procedures Procedures (including critical care time)  Medications Ordered in ED Medications  hydrogen peroxide 3 % external solution (has no administration in time range)  lidocaine-EPINEPHrine (XYLOCAINE W/EPI) 2 %-1:200000 (PF) injection 10 mL (has no administration in time range)  LORazepam (ATIVAN) tablet 1 mg (1 mg Oral Given 07/22/19 0048)    ED Course  I have reviewed the triage vital signs and the nursing notes.  Pertinent labs & imaging results that were available during my care of the patient were reviewed by me and considered in my medical decision making (see chart for details).    MDM Rules/Calculators/A&P                      Patient presents with a scalp laceration following a fall.  She is overall nontoxic-appearing and vital signs are reassuring.  CT scan ordered from triage and reviewed dependently.  No intracranial hemorrhage or traumatic bleed.  Scalp laceration repaired by Dahlia Client, PA.  Please see his note for full details.  Patient was given laceration care instructions and return for staple removal.  After history, exam, and medical workup I feel the patient has been appropriately medically screened and is safe for discharge home. Pertinent diagnoses were discussed with the patient. Patient was given return precautions.   Final Clinical Impression(s) / ED Diagnoses Final diagnoses:  Laceration of scalp, initial encounter    Rx / DC Orders ED Discharge Orders    None       Lenwood Balsam, Mayer Masker, MD 07/22/19 0126

## 2019-07-22 NOTE — ED Provider Notes (Signed)
LACERATION REPAIR Performed by: Roxy Horseman Authorized by: Roxy Horseman Consent: Verbal consent obtained. Risks and benefits: risks, benefits and alternatives were discussed Consent given by: patient Patient identity confirmed: provided demographic data Prepped and Draped in normal sterile fashion Wound explored  Laceration Location: posterior scalp  Laceration Length: 6 cm  No Foreign Bodies seen or palpated  Anesthesia: local infiltration  Local anesthetic: lidocaine 1% with epinephrine  Anesthetic total: 7 ml  Irrigation method: syringe Amount of cleaning: standard  Skin closure: staples  Number of sutures: 6  Technique: staples  Patient tolerance: Patient tolerated the procedure well with no immediate complications.    Roxy Horseman, PA-C 07/22/19 0106    Shon Baton, MD 07/22/19 [phone]

## 2019-07-27 ENCOUNTER — Encounter: Payer: Self-pay | Admitting: Physician Assistant

## 2019-07-27 ENCOUNTER — Ambulatory Visit: Payer: Medicare PPO | Admitting: Physician Assistant

## 2019-07-27 VITALS — BP 122/76 | HR 84 | Temp 98.2°F | Ht 65.0 in | Wt 179.0 lb

## 2019-07-27 DIAGNOSIS — K219 Gastro-esophageal reflux disease without esophagitis: Secondary | ICD-10-CM | POA: Diagnosis not present

## 2019-07-27 DIAGNOSIS — Z1211 Encounter for screening for malignant neoplasm of colon: Secondary | ICD-10-CM | POA: Diagnosis not present

## 2019-07-27 DIAGNOSIS — R1310 Dysphagia, unspecified: Secondary | ICD-10-CM

## 2019-07-27 DIAGNOSIS — Z1212 Encounter for screening for malignant neoplasm of rectum: Secondary | ICD-10-CM

## 2019-07-27 MED ORDER — OMEPRAZOLE 40 MG PO CPDR: 40.0000 mg | DELAYED_RELEASE_CAPSULE | Freq: Every day | ORAL | 2 refills | Status: DC

## 2019-07-27 NOTE — Patient Instructions (Addendum)
If you are age 47 or older, your body mass index should be between 23-30. Your Body mass index is 29.79 kg/m. If this is out of the aforementioned range listed, please consider follow up with your Primary Care Provider.  If you are age 59 or younger, your body mass index should be between 19-25. Your Body mass index is 29.79 kg/m. If this is out of the aformentioned range listed, please consider follow up with your Primary Care Provider.   We have sent the following medications to your pharmacy for you to pick up at your convenience:  Omeprazole 40 mg daily  You have been scheduled for a Barium Esophogram at [facility] Radiology (1st floor of the hospital) on 08/08/2019 at 10:15am. Please arrive 15 minutes prior to your appointment for registration. Make certain not to have anything to eat or drink 3 hours prior to your test. If you need to reschedule for any reason, please contact radiology at [phone] to do so. __________________________________________________________________ A barium swallow is an examination that concentrates on views of the esophagus. This tends to be a double contrast exam (barium and two liquids which, when combined, create a gas to distend the wall of the oesophagus) or single contrast (non-ionic iodine based). The study is usually tailored to your symptoms so a good history is essential. Attention is paid during the study to the form, structure and configuration of the esophagus, looking for functional disorders (such as aspiration, dysphagia, achalasia, motility and reflux) EXAMINATION You may be asked to change into a gown, depending on the type of swallow being performed. A radiologist and radiographer will perform the procedure. The radiologist will advise you of the type of contrast selected for your procedure and direct you during the exam. You will be asked to stand, sit or lie in several different positions and to hold a small amount of fluid in your mouth before  being asked to swallow while the imaging is performed .In some instances you may be asked to swallow barium coated marshmallows to assess the motility of a solid food bolus. The exam can be recorded as a digital or video fluoroscopy procedure. POST PROCEDURE It will take 1-2 days for the barium to pass through your system. To facilitate this, it is important, unless otherwise directed, to increase your fluids for the next 24-48hrs and to resume your normal diet.  This test typically takes about 30 minutes to perform. __________________________________________________________________________________   Due to recent changes in healthcare laws, you may see the results of your imaging and laboratory studies on MyChart before your provider has had a chance to review them.  We understand that in some cases there may be results that are confusing or concerning to you. Not all laboratory results come back in the same time frame and the provider may be waiting for multiple results in order to interpret others.  Please give [place] 48 hours in order for your provider to thoroughly review all the results before contacting the office for clarification of your results.

## 2019-07-27 NOTE — Progress Notes (Signed)
Chief Complaint: Dysphagia, GERD  HPI:    Barbara Gallegos is a 49 year old female with a past medical history of Huntington's disease, who was referred to me by Johny Blamer, MD for a complaint of dysphagia and GERD.      Today, the patient presents clinic accompanied by her husband and tells me that 10 years ago she started with problems of swallowing which was the start of her Huntington's symptoms.  She has been seeing speech therapy/OT since then who have been teaching her how to swallow and telling her what she can and cannot eat.  Over the past 6 months she has had an increase in symptoms to solids and liquids.  Explains that she saw physician a couple of years ago who recommended she have a barium swallow but "they never followed up with me at all".  Patient never had this done.  Does describe an EGD 10 years ago and being diagnosed with dysmotility.  Also describes some reflux symptoms about every other day with corresponding epigastric pain.  Does use Tums as needed which does provide some relief.    Denies fever, chills, weight loss or symptoms that awaken her from sleep.     Past Medical History:  Diagnosis Date  . Huntington disease [facility])     Past Surgical History:  Procedure Laterality Date  . HAND SURGERY    . HAND SURGERY      Current Outpatient Medications  Medication Sig Dispense Refill  . Norethindrone Acetate-Ethinyl Estrad-FE (LOESTRIN 24 FE) 1-20 MG-MCG(24) tablet Take 1 tablet by mouth daily. 3 Package 4  . sertraline (ZOLOFT) 50 MG tablet TK 1 T PO IN THE MORNING  12   No current facility-administered medications for this visit.    Allergies as of 07/27/2019  . (No Known Allergies)   Family history: Mother with history of Huntington's disease, no family history of colon cancer  Social History   Socioeconomic History  . Marital status: Married    Spouse name: Not on file  . Number of children: Not on file  . Years of education: Not on file  . Highest  education level: Not on file  Occupational History  . Not on file  Tobacco Use  . Smoking status: Never Smoker  . Smokeless tobacco: Never Used  Substance and Sexual Activity  . Alcohol use: Never  . Drug use: Never  . Sexual activity: Yes    Birth control/protection: None  Other Topics Concern  . Not on file  Social History Narrative  . Not on file   Social Determinants of Health   Financial Resource Strain:   . Difficulty of Paying Living Expenses: Not on file  Food Insecurity:   . Worried About Programme researcher, broadcasting/film/video in the Last Year: Not on file  . Ran Out of Food in the Last Year: Not on file  Transportation Needs:   . Lack of Transportation (Medical): Not on file  . Lack of Transportation (Non-Medical): Not on file  Physical Activity:   . Days of Exercise per Week: Not on file  . Minutes of Exercise per Session: Not on file  Stress:   . Feeling of Stress : Not on file  Social Connections:   . Frequency of Communication with Friends and Family: Not on file  . Frequency of Social Gatherings with Friends and Family: Not on file  . Attends Religious Services: Not on file  . Active Member of Clubs or Organizations: Not on file  .  Attends Archivist Meetings: Not on file  . Marital Status: Not on file  Intimate Partner Violence:   . Fear of Current or Ex-Partner: Not on file  . Emotionally Abused: Not on file  . Physically Abused: Not on file  . Sexually Abused: Not on file    Review of Systems:    Constitutional: No weight loss, fever or chills Skin: No rash  Cardiovascular: No chest pain Respiratory: No SOB  Gastrointestinal: See HPI and otherwise negative Genitourinary: No dysuria  Neurological: No headache Musculoskeletal: No new muscle or joint pain Hematologic: No bleeding  Psychiatric: No history of depression or anxiety   Physical Exam:  Vital signs: BP 122/76   Pulse 84   Temp 98.2 F (36.8 C) (Oral)   Ht 5\' 5"  (1.651 m)   Wt 179 lb  (81.2 kg)   BMI 29.79 kg/m    Constitutional:   Pleasant Caucasian female appears to be in NAD, Well developed, Well nourished, alert and cooperative Head:  Normocephalic and atraumatic. Eyes:   PEERL, EOMI. No icterus. Conjunctiva pink. Ears:  Normal auditory acuity. Neck:  Supple Throat: Oral cavity and pharynx without inflammation, swelling or lesion.  Respiratory: Respirations even and unlabored. Lungs clear to auscultation bilaterally.   No wheezes, crackles, or rhonchi.  Cardiovascular: Normal S1, S2. No MRG. Regular rate and rhythm. No peripheral edema, cyanosis or pallor.  Gastrointestinal:  Soft, nondistended, nontender. No rebound or guarding. Normal bowel sounds. No appreciable masses or hepatomegaly. Rectal:  Not performed.  Msk:  Symmetrical without gross deformities. Without edema, no deformity or joint abnormality.  Neurologic:  Alert and  oriented x4;  grossly normal neurologically. +chorea Skin:   Dry and intact without significant lesions or rashes. Psychiatric:  Demonstrates good judgement and reason without abnormal affect or behaviors.  No recent labs or imaging.  Assessment: 1.  Dysphagia: To both solids and liquids worse over the past 6 months, has been seeing PT/OT for speech therapy which is not really helping anymore, EGD 10 years ago with dysmotility; more than likely this is dysmotility given the diagnosis of Huntington's disease but will also consider stricture versus other 2.  GERD: Symptoms every other day per patient 3.  Screening for colorectal cancer: Patient is 62 and has never had screening for colon cancer  Plan: 1.  Discussed with patient that a good way to start would be a barium swallow with tablet for further evaluation of possible dysmotility versus a true stricture.  More than likely it is some dysmotility.  If this worsens to a point where the patient cannot eat may need to discuss a G-tube- may need EGD prior to this regardless. 2.  Started the  patient on Omeprazole 40 mg daily, 30-60 minutes before breakfast #30 with 5 refills. 3.  Briefly discussed screening for colorectal cancer.  The patient is now of the age to have a screening colonoscopy.  We can discuss this further pending results of barium swallow.  If she needs an EGD we can do both at the same time. 4.  Patient was assigned to Dr. Ardis Hughs today.  Barbara Newer, PA-C [facility] 07/27/2019, 3:29 PM  Cc: Tempie Hoist, MD

## 2019-07-29 ENCOUNTER — Other Ambulatory Visit: Payer: Self-pay

## 2019-07-29 ENCOUNTER — Encounter (HOSPITAL_BASED_OUTPATIENT_CLINIC_OR_DEPARTMENT_OTHER): Payer: Self-pay

## 2019-07-29 ENCOUNTER — Emergency Department (HOSPITAL_BASED_OUTPATIENT_CLINIC_OR_DEPARTMENT_OTHER)
Admission: EM | Admit: 2019-07-29 | Discharge: 2019-07-29 | Disposition: A | Discharge status: Home / Self Care | Payer: Medicare PPO | Attending: Emergency Medicine | Admitting: Emergency Medicine

## 2019-07-29 DIAGNOSIS — W01198D Fall on same level from slipping, tripping and stumbling with subsequent striking against other object, subsequent encounter: Secondary | ICD-10-CM | POA: Insufficient documentation

## 2019-07-29 DIAGNOSIS — Z79899 Other long term (current) drug therapy: Secondary | ICD-10-CM | POA: Insufficient documentation

## 2019-07-29 DIAGNOSIS — Z4802 Encounter for removal of sutures: Secondary | ICD-10-CM | POA: Diagnosis present

## 2019-07-29 DIAGNOSIS — S0101XD Laceration without foreign body of scalp, subsequent encounter: Secondary | ICD-10-CM | POA: Diagnosis not present

## 2019-07-29 NOTE — ED Notes (Signed)
ED Provider at bedside. 

## 2019-07-29 NOTE — Discharge Instructions (Signed)
Wound looks like it is healing well.  You can wash your hair as you normally would.  The area will continue to complete its healing if you develop any pain, redness, warmth or drainage follow-up with your PCP.

## 2019-07-29 NOTE — ED Provider Notes (Signed)
[facility] EMERGENCY DEPARTMENT Provider Note   CSN: [id number] Arrival date & time: 07/29/19  1505     History Chief Complaint  Patient presents with  . Suture / Staple Removal    [employer]os[employer] Null is a 49 y.o. female.  Deannie Resetar is a 49 y.o. female with history of Huntington's disease, presents to the ED for staple removal.  Patient was seen in the ED on 1/3 after she tripped getting out of her car and fell striking her head resulting in a scalp laceration just above the occiput.  This was repaired with 6 staples.  Patient states it has been healing well she has not noticed any increasing pain, redness, swelling or drainage.  Patient denies headaches.  No other aggravating or alleviating factors.        Past Medical History:  Diagnosis Date  . Huntington disease ([place])   . Prediabetes     There are no problems to display for this patient.   Past Surgical History:  Procedure Laterality Date  . HAND SURGERY    . HAND SURGERY       OB History    Gravida  2   Para  2   Term      Preterm      AB      Living  2     SAB      TAB      Ectopic      Multiple      Live Births              Family History  Problem Relation Age of Onset  . Huntington's disease Mother   . Heart disease Father   . Diabetes Maternal Grandmother     Social History   Tobacco Use  . Smoking status: Never Smoker  . Smokeless tobacco: Never Used  Substance Use Topics  . Alcohol use: Never  . Drug use: Never    Home Medications Prior to Admission medications   Medication Sig Start Date End Date Taking? Authorizing Provider  Deutetrabenazine (AUSTEDO) 12 MG TABS Take 1 tablet by mouth 2 (two) times daily.    [provider]  Norethindrone Acetate-Ethinyl Estrad-FE (LOESTRIN 24 FE) 1-20 MG-MCG(24) tablet Take 1 tablet by mouth daily. 12/21/18   Princess Bruins, MD  omeprazole (PRILOSEC) 40 MG capsule Take 1 capsule (40 mg total) by mouth  daily. 07/27/19 08/26/19  Milus Banister, MD  sertraline (ZOLOFT) 50 MG tablet TK 1 T PO IN THE MORNING 12/30/17   [provider]    Allergies    Patient has no known allergies.  Review of Systems   Review of Systems  Constitutional: Negative for chills and fever.  Skin: Positive for wound.  Neurological: Negative for headaches.    Physical Exam Updated Vital Signs BP 135/63 (BP Location: Right Arm)   Pulse 88   Temp 98.8 F (37.1 C) (Oral)   Resp 18   Ht 5\' 6"  (1.676 m)   Wt 81.2 kg   LMP 07/19/2019   SpO2 96%   BMI 28.89 kg/m   Physical Exam Vitals and nursing note reviewed.  Constitutional:      General: She is not in acute distress.    Appearance: She is well-developed. She is not diaphoretic.  HENT:     Head: Normocephalic.     Comments: Linear laceration just superior to the occiput with 6 staples in place, appears clean dry and intact with good wound healing Eyes:  General:        Right eye: No discharge.        Left eye: No discharge.  Pulmonary:     Effort: Pulmonary effort is normal. No respiratory distress.  Skin:    General: Skin is warm and dry.  Neurological:     Mental Status: She is alert.     Coordination: Coordination normal.  Psychiatric:        Mood and Affect: Mood normal.        Behavior: Behavior normal.     ED Results / Procedures / Treatments   Labs (all labs ordered are listed, but only abnormal results are displayed) Labs Reviewed - No data to display  EKG None  Radiology No results found.  Procedures .Suture Removal  Date/Time: 07/29/2019 3:25 PM Performed by: Dartha Lodge, PA-C Authorized by: Dartha Lodge, PA-C   Consent:    Consent obtained:  Verbal   Consent given by:  Patient   Risks discussed:  Bleeding, pain and wound separation   Alternatives discussed:  No treatment Location:    Location:  Head/neck   Head/neck location:  Scalp Procedure details:    Wound appearance:  No signs of  infection, good wound healing and clean   Number of sutures removed:  0   Number of staples removed:  6 Post-procedure details:    Post-removal:  No dressing applied   Patient tolerance of procedure:  Tolerated well, no immediate complications   (including critical care time)  Medications Ordered in ED Medications - No data to display  ED Course  I have reviewed the triage vital signs and the nursing notes.  Pertinent labs & imaging results that were available during my care of the patient were reviewed by me and considered in my medical decision making (see chart for details).    MDM Rules/Calculators/A&P                      Staple removal   Pt to ER for staple/suture removal and wound check as above. Procedure tolerated well. Vitals normal, no signs of infection. Scar minimization & return precautions given at dc.   Final Clinical Impression(s) / ED Diagnoses Final diagnoses:  Encounter for staple removal    Rx / DC Orders ED Discharge Orders    None       Legrand Rams 07/29/19 1531    Cathren Laine, MD 07/29/19 [phone]

## 2019-07-29 NOTE — ED Triage Notes (Signed)
Pt here for Staple removal

## 2019-07-29 NOTE — ED Notes (Signed)
Healing wound to back of head. No redness. No swelling. Pt denies fevers. Pt denies pain

## 2019-07-30 NOTE — Progress Notes (Signed)
I agree with the above note, plan 

## 2019-08-08 ENCOUNTER — Ambulatory Visit (HOSPITAL_COMMUNITY)
Admission: RE | Admit: 2019-08-08 | Discharge: 2019-08-08 | Disposition: A | Discharge status: Home / Self Care | Payer: Medicare PPO | Source: Ambulatory Visit | Attending: Gastroenterology | Admitting: Gastroenterology

## 2019-08-08 ENCOUNTER — Other Ambulatory Visit: Payer: Self-pay

## 2019-08-08 DIAGNOSIS — K219 Gastro-esophageal reflux disease without esophagitis: Secondary | ICD-10-CM | POA: Insufficient documentation

## 2019-08-08 DIAGNOSIS — R1310 Dysphagia, unspecified: Secondary | ICD-10-CM | POA: Diagnosis present

## 2019-08-10 ENCOUNTER — Other Ambulatory Visit (HOSPITAL_COMMUNITY): Payer: Self-pay

## 2019-08-10 ENCOUNTER — Other Ambulatory Visit: Payer: Self-pay

## 2019-08-10 DIAGNOSIS — R1310 Dysphagia, unspecified: Secondary | ICD-10-CM

## 2019-08-15 ENCOUNTER — Ambulatory Visit (HOSPITAL_COMMUNITY)
Admission: RE | Admit: 2019-08-15 | Discharge: 2019-08-15 | Disposition: A | Discharge status: Home / Self Care | Payer: Medicare PPO | Source: Ambulatory Visit | Attending: Gastroenterology | Admitting: Gastroenterology

## 2019-08-15 ENCOUNTER — Other Ambulatory Visit: Payer: Self-pay

## 2019-08-15 DIAGNOSIS — R1310 Dysphagia, unspecified: Secondary | ICD-10-CM | POA: Diagnosis not present

## 2019-09-18 ENCOUNTER — Ambulatory Visit: Payer: Medicare PPO | Admitting: Gastroenterology

## 2019-10-26 ENCOUNTER — Other Ambulatory Visit: Payer: Self-pay

## 2019-10-26 ENCOUNTER — Ambulatory Visit: Payer: Medicare PPO | Admitting: Gastroenterology

## 2019-10-26 ENCOUNTER — Encounter: Payer: Self-pay | Admitting: Gastroenterology

## 2019-10-26 VITALS — BP 121/70 | HR 86 | Temp 97.9°F | Ht 65.0 in | Wt 173.0 lb

## 2019-10-26 DIAGNOSIS — R1310 Dysphagia, unspecified: Secondary | ICD-10-CM | POA: Diagnosis not present

## 2019-10-26 NOTE — Progress Notes (Signed)
Review of pertinent gastrointestinal problems: 1.  Dysphagia related to Barbara Gallegos Huntington's disease.  Barium esophagram January 2021 showed no strictures or masses.  Did suggest moderate esophageal dysmotility.  Speech therapy modified barium swallow study January 2021 suggested "oral more than pharyngeal dysphagia".    HPI: This is a very pleasant 49 year old woman whom I am meeting for the first time.  She was here in our office and saw Victorino Dike about 2 months ago.  She underwent a barium esophagram and speech therapy modified barium swallow study.  She has been following up with speech therapy.  She tells me Barbara Gallegos swallowing is much improved.  She also attributes a lot of this to change to organic only food.  Barbara Gallegos husband is with Barbara Gallegos today   ROS: complete GI ROS as described in HPI, all other review negative.  Constitutional:  No unintentional weight loss   Past Medical History:  Diagnosis Date  . Huntington disease (HCC)   . Prediabetes     Past Surgical History:  Procedure Laterality Date  . HAND SURGERY    . HAND SURGERY      Current Outpatient Medications  Medication Sig Dispense Refill  . Deutetrabenazine (AUSTEDO) 12 MG TABS Take 1 tablet by mouth 2 (two) times daily.    . Norethindrone Acetate-Ethinyl Estrad-FE (LOESTRIN 24 FE) 1-20 MG-MCG(24) tablet Take 1 tablet by mouth daily. 3 Package 4  . oxycodone-acetaminophen (LYNOX) 10-300 MG tablet Take 1 tablet by mouth every 4 (four) hours as needed for pain.    Marland Kitchen sertraline (ZOLOFT) 50 MG tablet TK 1 T PO IN THE MORNING  12  . omeprazole (PRILOSEC) 40 MG capsule Take 1 capsule (40 mg total) by mouth daily. 30 capsule 2   No current facility-administered medications for this visit.    Allergies as of 10/26/2019  . (No Known Allergies)    Family History  Problem Relation Age of Onset  . Huntington's disease Mother   . Heart disease Father   . Diabetes Maternal Grandmother     Social History   Socioeconomic History   . Marital status: Married    Spouse name: Not on file  . Number of children: Not on file  . Years of education: Not on file  . Highest education level: Not on file  Occupational History  . Not on file  Tobacco Use  . Smoking status: Never Smoker  . Smokeless tobacco: Never Used  Substance and Sexual Activity  . Alcohol use: Never  . Drug use: Never  . Sexual activity: Yes    Birth control/protection: None  Other Topics Concern  . Not on file  Social History Narrative  . Not on file   Social Determinants of Health   Financial Resource Strain:   . Difficulty of Paying Living Expenses:   Food Insecurity:   . Worried About Programme researcher, broadcasting/film/video in the Last Year:   . Barista in the Last Year:   Transportation Needs:   . Freight forwarder (Medical):   Marland Kitchen Lack of Transportation (Non-Medical):   Physical Activity:   . Days of Exercise per Week:   . Minutes of Exercise per Session:   Stress:   . Feeling of Stress :   Social Connections:   . Frequency of Communication with Friends and Family:   . Frequency of Social Gatherings with Friends and Family:   . Attends Religious Services:   . Active Member of Clubs or Organizations:   . Attends Club  or Organization Meetings:   Marland Kitchen Marital Status:   Intimate Partner Violence:   . Fear of Current or Ex-Partner:   . Emotionally Abused:   Marland Kitchen Physically Abused:   . Sexually Abused:      Physical Exam: BP 121/70   Pulse 86   Temp 97.9 F (36.6 C)   Ht 5\' 5"  (1.651 m)   Wt 173 lb (78.5 kg)   SpO2 98%   BMI 28.79 kg/m  Constitutional: generally well-appearing, obvious facial and upper body tics Psychiatric: alert and oriented x3 Abdomen: soft, nontender, nondistended, no obvious ascites, no peritoneal signs, normal bowel sounds No peripheral edema noted in lower extremities  Assessment and plan: 49 y.o. female with oropharyngeal dysphagia from Huntington's disease  I had a nice discussion with Barbara Gallegos and Barbara Gallegos husband.   She has made some modifications on the way she eats and also the type of food she is eating and it seems to be helping Barbara Gallegos eat better, more safely.  I explained to them if she has trouble with an acute worsening of Barbara Gallegos swallowing or if she becomes unable to maintain adequate nutrition then they should call.  Please see the "Patient Instructions" section for addition details about the plan.  Owens Loffler, MD [facility] 10/26/2019, 3:31 PM   Total time on date of encounter was 20 minutes (this included time spent preparing to see the patient reviewing records; obtaining and/or reviewing separately obtained history; performing a medically appropriate exam and/or evaluation; counseling and educating the patient and family if present; ordering medications, tests or procedures if applicable; and documenting clinical information in the health record).

## 2019-10-26 NOTE — Patient Instructions (Signed)
If you are age 47 or older, your body mass index should be between 23-30. Your Body mass index is 28.79 kg/m. If this is out of the aforementioned range listed, please consider follow up with your Primary Care Provider.  If you are age 89 or younger, your body mass index should be between 19-25. Your Body mass index is 28.79 kg/m. If this is out of the aformentioned range listed, please consider follow up with your Primary Care Provider.   Follow-up as needed.   It was a pleasure to see you today!  Dr.Jacobs

## 2019-12-18 ENCOUNTER — Ambulatory Visit: Payer: Medicare PPO | Admitting: Gastroenterology

## 2019-12-21 ENCOUNTER — Other Ambulatory Visit: Payer: Self-pay | Admitting: Obstetrics & Gynecology

## 2019-12-24 NOTE — Telephone Encounter (Signed)
CE Scheduled for 03/03/20.

## 2019-12-24 NOTE — Telephone Encounter (Signed)
Pt due for CE. I sent Toniann Fail a message to please call and schedule. Once scheduled I will refill 90 days supply.

## 2020-03-03 ENCOUNTER — Ambulatory Visit: Payer: Medicare PPO | Admitting: Obstetrics & Gynecology

## 2020-03-03 ENCOUNTER — Encounter: Payer: Self-pay | Admitting: Obstetrics & Gynecology

## 2020-03-03 ENCOUNTER — Other Ambulatory Visit: Payer: Self-pay

## 2020-03-03 VITALS — BP 112/70 | Ht 64.75 in | Wt 169.0 lb

## 2020-03-03 DIAGNOSIS — Z01419 Encounter for gynecological examination (general) (routine) without abnormal findings: Secondary | ICD-10-CM

## 2020-03-03 DIAGNOSIS — N870 Mild cervical dysplasia: Secondary | ICD-10-CM | POA: Diagnosis not present

## 2020-03-03 DIAGNOSIS — Z3009 Encounter for other general counseling and advice on contraception: Secondary | ICD-10-CM

## 2020-03-03 DIAGNOSIS — R87810 Cervical high risk human papillomavirus (HPV) DNA test positive: Secondary | ICD-10-CM | POA: Diagnosis not present

## 2020-03-03 NOTE — Progress Notes (Signed)
Barbara Gallegos 1970-11-20 [id number]   History:    49 y.o. G2P2L2.  Married x 2 year.  RP:  Established patient presenting for annual gyn exam   HPI: Menses usually regular every month. No BTB. No pelvic pain.  No abnormal discharge.  No pain with IC.  No contraception.  Urine/BMs normal.  Breasts normal.  BMI 28.34.  Huntington's disease with frequent falls.     Past medical history,surgical history, family history and social history were all reviewed and documented in the EPIC chart.  Gynecologic History Patient's last menstrual period was 02/04/2020.  Obstetric History OB History  Gravida Para Term Preterm AB Living  '2 2       2  ' SAB TAB Ectopic Multiple Live Births               # Outcome Date GA Lbr Len/2nd Weight Sex Delivery Anes PTL Lv  2 Para           1 Para              ROS: A ROS was performed and pertinent positives and negatives are included in the history.  GENERAL: No fevers or chills. HEENT: No change in vision, no earache, sore throat or sinus congestion. NECK: No pain or stiffness. CARDIOVASCULAR: No chest pain or pressure. No palpitations. PULMONARY: No shortness of breath, cough or wheeze. GASTROINTESTINAL: No abdominal pain, nausea, vomiting or diarrhea, melena or bright red blood per rectum. GENITOURINARY: No urinary frequency, urgency, hesitancy or dysuria. MUSCULOSKELETAL: No joint or muscle pain, no back pain, no recent trauma. DERMATOLOGIC: No rash, no itching, no lesions. ENDOCRINE: No polyuria, polydipsia, no heat or cold intolerance. No recent change in weight. HEMATOLOGICAL: No anemia or easy bruising or bleeding. NEUROLOGIC: No headache, seizures, numbness, tingling or weakness. PSYCHIATRIC: No depression, no loss of interest in normal activity or change in sleep pattern.     Exam:   BP 112/70   Ht 5' 4.75" (1.645 m)   Wt 169 lb (76.7 kg)   LMP 02/04/2020   BMI 28.34 kg/m   Body mass index is 28.34 kg/m.  General appearance : Well  developed well nourished female. No acute distress HEENT: Eyes: no retinal hemorrhage or exudates,  Neck supple, trachea midline, no carotid bruits, no thyroidmegaly Lungs: Clear to auscultation, no rhonchi or wheezes, or rib retractions  Heart: Regular rate and rhythm, no murmurs or gallops Breast:Examined in sitting and supine position were symmetrical in appearance, no palpable masses or tenderness,  no skin retraction, no nipple inversion, no nipple discharge, no skin discoloration, no axillary or supraclavicular lymphadenopathy Abdomen: no palpable masses or tenderness, no rebound or guarding Extremities: no edema or skin discoloration or tenderness  Pelvic: Vulva: Normal             Vagina: No gross lesions or discharge  Cervix: No gross lesions or discharge.  Pap reflex done.  Uterus  AV, normal size, shape and consistency, non-tender and mobile  Adnexa  Without masses or tenderness  Anus: Normal   Assessment/Plan:  49 y.o. female for annual exam   1. Encounter for routine gynecological examination with Papanicolaou smear of cervix Normal gynecologic exam.  Pap reflex done.  Breast exam normal.  Schedule a screening mammogram now.  Body mass index 28.34.  Recommend a slightly lower calorie/carb diet.  We will follow-up here for fasting health labs. - CBC; Future - Comp Met (CMET); Future - Lipid panel; Future - TSH; Future - VITAMIN  D 25 Hydroxy (Vit-D Deficiency, Fractures); Future  2. Dysplasia of cervix, low grade (CIN 1) Pap reflex done today.  3. Cervical high risk HPV (human papillomavirus) test positive Pap reflex done today.  4. Encounter for other general counseling or advice on contraception Declines contraception.  Princess Bruins MD, 4:05 PM 03/03/2020

## 2020-03-04 NOTE — Addendum Note (Signed)
Addended by: Berna Spare A on: 03/04/2020 02:11 PM   Modules accepted: Orders

## 2020-03-06 LAB — PAP IG W/ RFLX HPV ASCU

## 2020-03-10 ENCOUNTER — Other Ambulatory Visit: Payer: Medicare PPO

## 2020-03-25 ENCOUNTER — Other Ambulatory Visit: Payer: Self-pay | Admitting: Obstetrics & Gynecology

## 2020-05-26 ENCOUNTER — Emergency Department (HOSPITAL_BASED_OUTPATIENT_CLINIC_OR_DEPARTMENT_OTHER): Payer: Medicare PPO

## 2020-05-26 ENCOUNTER — Other Ambulatory Visit: Payer: Self-pay

## 2020-05-26 ENCOUNTER — Encounter (HOSPITAL_BASED_OUTPATIENT_CLINIC_OR_DEPARTMENT_OTHER): Payer: Self-pay | Admitting: *Deleted

## 2020-05-26 ENCOUNTER — Emergency Department (HOSPITAL_BASED_OUTPATIENT_CLINIC_OR_DEPARTMENT_OTHER)
Admission: EM | Admit: 2020-05-26 | Discharge: 2020-05-27 | Disposition: A | Discharge status: Home / Self Care | Payer: Medicare PPO | Attending: Emergency Medicine | Admitting: Emergency Medicine

## 2020-05-26 DIAGNOSIS — S62639A Displaced fracture of distal phalanx of unspecified finger, initial encounter for closed fracture: Secondary | ICD-10-CM

## 2020-05-26 DIAGNOSIS — Y92009 Unspecified place in unspecified non-institutional (private) residence as the place of occurrence of the external cause: Secondary | ICD-10-CM | POA: Insufficient documentation

## 2020-05-26 DIAGNOSIS — Z79899 Other long term (current) drug therapy: Secondary | ICD-10-CM | POA: Diagnosis not present

## 2020-05-26 DIAGNOSIS — S62662A Nondisplaced fracture of distal phalanx of right middle finger, initial encounter for closed fracture: Secondary | ICD-10-CM | POA: Diagnosis not present

## 2020-05-26 DIAGNOSIS — S62642A Nondisplaced fracture of proximal phalanx of right middle finger, initial encounter for closed fracture: Secondary | ICD-10-CM | POA: Insufficient documentation

## 2020-05-26 DIAGNOSIS — Y939 Activity, unspecified: Secondary | ICD-10-CM | POA: Insufficient documentation

## 2020-05-26 DIAGNOSIS — U071 COVID-19: Secondary | ICD-10-CM | POA: Diagnosis not present

## 2020-05-26 DIAGNOSIS — W19XXXA Unspecified fall, initial encounter: Secondary | ICD-10-CM | POA: Diagnosis not present

## 2020-05-26 DIAGNOSIS — S62660A Nondisplaced fracture of distal phalanx of right index finger, initial encounter for closed fracture: Secondary | ICD-10-CM | POA: Diagnosis not present

## 2020-05-26 DIAGNOSIS — R509 Fever, unspecified: Secondary | ICD-10-CM | POA: Diagnosis present

## 2020-05-26 DIAGNOSIS — Y999 Unspecified external cause status: Secondary | ICD-10-CM | POA: Diagnosis not present

## 2020-05-26 DIAGNOSIS — S62619A Displaced fracture of proximal phalanx of unspecified finger, initial encounter for closed fracture: Secondary | ICD-10-CM

## 2020-05-26 LAB — RESPIRATORY PANEL BY RT PCR (FLU A&B, COVID)
Influenza A by PCR: NEGATIVE
Influenza B by PCR: NEGATIVE
SARS Coronavirus 2 by RT PCR: POSITIVE — AB

## 2020-05-26 MED ORDER — FAMOTIDINE IN NACL 20-0.9 MG/50ML-% IV SOLN: 20.0000 mg | Freq: Once | INTRAVENOUS | Status: DC | PRN

## 2020-05-26 MED ORDER — ACETAMINOPHEN 160 MG/5ML PO SOLN
650.0000 mg | Freq: Once | ORAL | Status: AC
  Administered 2020-05-26: 650 mg via ORAL

## 2020-05-26 MED ORDER — SODIUM CHLORIDE 0.9 % IV SOLN
INTRAVENOUS | Status: DC | PRN
  Administered 2020-05-27: 1000 mL via INTRAVENOUS

## 2020-05-26 MED ORDER — EPINEPHRINE 0.3 MG/0.3ML IJ SOAJ: 0.3000 mg | Freq: Once | INTRAMUSCULAR | Status: DC | PRN

## 2020-05-26 MED ORDER — ALBUTEROL SULFATE HFA 108 (90 BASE) MCG/ACT IN AERS: 2.0000 | INHALATION_SPRAY | Freq: Once | RESPIRATORY_TRACT | Status: DC | PRN

## 2020-05-26 MED ORDER — DIPHENHYDRAMINE HCL 50 MG/ML IJ SOLN: 50.0000 mg | Freq: Once | INTRAMUSCULAR | Status: DC | PRN

## 2020-05-26 MED ORDER — ACETAMINOPHEN 160 MG/5ML PO SOLN
ORAL | Status: AC
  Administered 2020-05-27: 650 mg via ORAL
  Filled 2020-05-26: qty 20.3

## 2020-05-26 MED ORDER — METHYLPREDNISOLONE SODIUM SUCC 125 MG IJ SOLR: 125.0000 mg | Freq: Once | INTRAMUSCULAR | Status: DC | PRN

## 2020-05-26 MED ORDER — SODIUM CHLORIDE 0.9 % IV SOLN
1200.0000 mg | Freq: Once | INTRAVENOUS | Status: AC
  Administered 2020-05-27: 1200 mg via INTRAVENOUS
  Filled 2020-05-26: qty 10

## 2020-05-26 NOTE — ED Notes (Signed)
Pt has cough and some shortness of breath, complains of pain on "Whole right side" due to a fall. Distal joint of third finger on right hand has swelling and redness.

## 2020-05-26 NOTE — ED Provider Notes (Addendum)
[facility] Provider Note: Lowella Dell, MD, FACEP  CSN: [id number] MRN: [id number] ARRIVAL: 05/26/20 at 1834 ROOM: MH04/[facility]04   CHIEF COMPLAINT  Fever   HISTORY OF PRESENT ILLNESS  05/26/20 11:47 PM Barbara Gallegos is a 49 y.o. female with Huntington's disease.  She is here with flulike symptoms.  Specifically she has had nasal congestion, sore throat, cough, shortness of breath, fever and generalized fatigue.  She is vague about when the symptoms began, perhaps as long as 2 weeks ago, but told the triage nurse they have been present for 2 days.  She has Huntington's disease and states that whenever she gets sick it exacerbates her Huntington's symptoms and may be causing her to be confused.  She was given acetaminophen for fever and body aches and body aches in triage with improvement.  She was also given 2 puffs of albuterol for shortness of breath with minimal improvement.  She states she is still having difficulty taking a deep breath.  The patient states she fell at home earlier today and injured her right abdomen and the second and third fingers of her right hand.   Past Medical History:  Diagnosis Date  . Huntington disease (HCC)   . Prediabetes     Past Surgical History:  Procedure Laterality Date  . HAND SURGERY    . HAND SURGERY      Family History  Problem Relation Age of Onset  . Huntington's disease Mother   . Heart disease Father   . Diabetes Maternal Grandmother     Social History   Tobacco Use  . Smoking status: Never Smoker  . Smokeless tobacco: Never Used  Vaping Use  . Vaping Use: Never used  Substance Use Topics  . Alcohol use: Never  . Drug use: Never    Prior to Admission medications   Medication Sig Start Date End Date Taking? Authorizing Provider  Norethindrone Acetate-Ethinyl Estrad-FE (LOESTRIN 24 FE) 1-20 MG-MCG(24) tablet Take by mouth. 12/21/18  Yes [provider]  Deutetrabenazine (AUSTEDO) 12 MG TABS Take 1  tablet by mouth 2 (two) times daily.    [provider]  Multiple Vitamin (MULTI-VITAMIN) tablet Take 1 tablet by mouth daily.    [provider]  sertraline (ZOLOFT) 100 MG tablet Take 100 mg by mouth daily. 02/25/20   [provider]  omeprazole (PRILOSEC) 40 MG capsule Take 1 capsule (40 mg total) by mouth daily. 07/27/19 05/27/20  Rachael Fee, MD    Allergies Patient has no known allergies.   REVIEW OF SYSTEMS  Negative except as noted here or in the History of Present Illness.   PHYSICAL EXAMINATION  Initial Vital Signs Blood pressure 134/64, pulse 96, temperature 99.8 F (37.7 C), temperature source Oral, resp. rate 16, height 5\' 5"  (1.651 m), weight 72.6 kg, last menstrual period 05/07/2020, SpO2 97 %.  Examination General: Well-developed, well-nourished female in no acute distress; appearance consistent with age of record HENT: normocephalic; atraumatic Eyes: pupils equal, round and reactive to light; extraocular muscles intact Neck: supple Heart: regular rate and rhythm Lungs: clear to auscultation bilaterally; shallow breaths Abdomen: soft; nondistended; nontender; bowel sounds present; hematoma of right lower abdominal pannus Extremities: No deformity; full range of motion; pulses normal; tenderness of the distal phalanges of the right second and third fingers Neurologic: Awake, alert; dysarthria; ataxia; motor function intact in all extremities and symmetric; no facial droop Skin: Warm and dry Psychiatric: Normal mood and affect   RESULTS  Summary of this  visit's results, reviewed and interpreted by myself:   EKG Interpretation  Date/Time:    Ventricular Rate:    PR Interval:    QRS Duration:   QT Interval:    QTC Calculation:   R Axis:     Text Interpretation:        Laboratory Studies: Results for orders placed or performed during the hospital encounter of 05/26/20 (from the past 24 hour(s))  Respiratory Panel by RT PCR (Flu  A&B, Covid) - Nasopharyngeal Swab     Status: Abnormal   Collection Time: 05/26/20  6:51 PM   Specimen: Nasopharyngeal Swab  Result Value Ref Range   SARS Coronavirus 2 by RT PCR POSITIVE (A) NEGATIVE   Influenza A by PCR NEGATIVE NEGATIVE   Influenza B by PCR NEGATIVE NEGATIVE   Imaging Studies: DG Chest Portable 1 View  Result Date: 05/26/2020 CLINICAL DATA:  Fever and cough. EXAM: PORTABLE CHEST 1 VIEW COMPARISON:  07/15/2018 FINDINGS: 1848 hours. Low volume film. The lungs are clear without focal pneumonia, edema, pneumothorax or pleural effusion. The cardiopericardial silhouette is within normal limits for size. The visualized bony structures of the thorax show no acute abnormality. IMPRESSION: No active disease. Electronically Signed   By: Kennith Center M.D.   On: 05/26/2020 19:06   DG Hand Complete Right  Result Date: 05/27/2020 CLINICAL DATA:  Fall, pain of the second and third distal phalanges EXAM: RIGHT HAND - COMPLETE 3+ VIEW COMPARISON:  None. FINDINGS: Punctate radiodensities are seen along the dorsal aspect of the second and third distal interphalangeal joints with some associated soft tissue thickening. These could reflect age indeterminate osteophyte fragments are small avulsions. Additional crescentic mineralization seen along the ulnar base of third proximal phalanx which could reflect an additional fracture. No other acute or suspicious osseous abnormality. IMPRESSION: 1. Punctate radiodensities along the dorsal aspect of the second and third distal interphalangeal joints with some associated soft tissue thickening. These could reflect age indeterminate osteophyte fragments versus small avulsion. 2. Possible fracture fragment seen adjacent the ulnar base of the third proximal phalanx. Electronically Signed   By: Kreg Shropshire M.D.   On: 05/27/2020 00:24    ED COURSE and MDM  Nursing notes, initial and subsequent vitals signs, including pulse oximetry, reviewed and interpreted by  myself.  Vitals:   05/27/20 0138 05/27/20 0246 05/27/20 0342 05/27/20 0438  BP: 121/74 128/75 121/76 118/67  Pulse: (!) 108 96 91 90  Resp: 20 20 (!) 22 20  Temp: (!) 101.5 F (38.6 C) (!) 101.2 F (38.4 C) 98.8 F (37.1 C)   TempSrc: Oral Oral Oral   SpO2: 98% 96% 96% 98%  Weight:      Height:       Medications  0.9 %  sodium chloride infusion (1,000 mLs Intravenous New Bag/Given 05/27/20 0115)  diphenhydrAMINE (BENADRYL) injection 50 mg (has no administration in time range)  famotidine (PEPCID) IVPB 20 mg premix (has no administration in time range)  methylPREDNISolone sodium succinate (SOLU-MEDROL) 125 mg/2 mL injection 125 mg (has no administration in time range)  EPINEPHrine (EPI-PEN) injection 0.3 mg (has no administration in time range)  albuterol (VENTOLIN HFA) 108 (90 Base) MCG/ACT inhaler 2 puff (has no administration in time range)  acetaminophen (TYLENOL) 160 MG/5ML solution 650 mg (650 mg Oral Given 05/26/20 1849)  casirivimab-imdevimab (REGEN-COV) 1,200 mg in sodium chloride 0.9 % 110 mL IVPB ( Intravenous Stopped 05/27/20 0243)  albuterol (VENTOLIN HFA) 108 (90 Base) MCG/ACT inhaler 2 puff (2 puffs  Inhalation Given 05/27/20 0110)  acetaminophen (TYLENOL) 160 MG/5ML solution 650 mg (650 mg Oral Given 05/27/20 0124)   11:58 PM We will administer the monoclonal antibody infusion as the patient's underlying medical problems qualify her his high risk.  She notes she has been "fully vaccinated".   4:02 AM Infusion completed without incident (apart from fever, likely due to the Covid itself).  Will refer to Dr. Orlan Leavens for follow-up on suspected hand fractures.   PROCEDURES  Procedures   ED DIAGNOSES     ICD-10-CM   1. COVID-19 virus infection  U07.1   2. Fall in home, initial encounter  W19.XXXA    Y92.009   3. Closed avulsion fracture of distal phalanx of finger, initial encounter  S62.639A   4. Closed avulsion fracture of proximal phalanx of finger, initial  encounter  U23.536R        Paula Libra, MD 05/27/20 0403    Paula Libra, MD 05/27/20 [phone]

## 2020-05-26 NOTE — ED Triage Notes (Signed)
C/o fever , cough generalized fatigue x 2 days

## 2020-05-27 ENCOUNTER — Emergency Department (HOSPITAL_BASED_OUTPATIENT_CLINIC_OR_DEPARTMENT_OTHER): Payer: Medicare PPO

## 2020-05-27 MED ORDER — ALBUTEROL SULFATE HFA 108 (90 BASE) MCG/ACT IN AERS
2.0000 | INHALATION_SPRAY | RESPIRATORY_TRACT | Status: DC | PRN
  Filled 2020-05-27: qty 6.7

## 2020-05-27 MED ORDER — ACETAMINOPHEN 160 MG/5ML PO SOLN
ORAL | Status: AC
  Filled 2020-05-27: qty 20.3

## 2020-05-27 MED ORDER — ALBUTEROL SULFATE HFA 108 (90 BASE) MCG/ACT IN AERS
2.0000 | INHALATION_SPRAY | Freq: Once | RESPIRATORY_TRACT | Status: AC
  Administered 2020-05-27: 2 via RESPIRATORY_TRACT

## 2020-05-27 MED ORDER — ACETAMINOPHEN 160 MG/5ML PO SOLN: 650.0000 mg | Freq: Once | ORAL | Status: AC

## 2020-05-27 NOTE — ED Notes (Signed)
Infusion restarted hypersensitivity meds help per EDP

## 2020-05-27 NOTE — ED Notes (Signed)
Pre infusion vitals showed pt temp increased EDP made aware will treat fever and continue infusion per EDP.

## 2020-05-27 NOTE — ED Notes (Signed)
Infusion paused due to increased temp per protocol.

## 2020-09-10 ENCOUNTER — Telehealth: Payer: Self-pay

## 2020-09-10 NOTE — Telephone Encounter (Signed)
Attempted to contact patient to schedule a Palliative Care consult appointment. No answer left a message to return call.  

## 2020-09-12 ENCOUNTER — Telehealth: Payer: Self-pay

## 2020-09-12 NOTE — Telephone Encounter (Signed)
Spoke with patient's husband Freida Busman and scheduled an in-person Palliative Consult for 10/07/20 @ 2PM  COVID screening was negative. One dog in the home, Freida Busman will put away before NP arrives. Patient lives with husband and son.   Consent obtained; updated Outlook/Netsmart/Team List and Epic.  Family is aware they may be receiving a call from NP the day before or day of to confirm appointment.

## 2020-09-29 ENCOUNTER — Other Ambulatory Visit: Payer: Medicare PPO | Admitting: Nurse Practitioner

## 2020-09-29 ENCOUNTER — Other Ambulatory Visit: Payer: Self-pay | Admitting: Obstetrics & Gynecology

## 2020-09-29 ENCOUNTER — Other Ambulatory Visit: Payer: Self-pay

## 2020-09-29 DIAGNOSIS — G10 Huntington's disease: Secondary | ICD-10-CM

## 2020-09-29 DIAGNOSIS — Z515 Encounter for palliative care: Secondary | ICD-10-CM

## 2020-09-29 NOTE — Progress Notes (Addendum)
Barbara Gallegos Telephone: [phone]  Fax: [phone]  PATIENT NAME: Barbara Gallegos [address] [place] [address] [phone] (home)  DOB: 1971-01-24 MRN: [id number]  PRIMARY CARE PROVIDER:    Jolinda Croak, MD [facility]  REFERRING PROVIDER:   Tempie Gallegos, [address] [place],  [place] [address] [phone]  RESPONSIBLE PARTY:   Extended Emergency Contact Information Primary Emergency Contact: Barbara Gallegos Address: Barbara Gallegos          Barbara Gallegos, [place] [address] Barbara Gallegos Phone: [phone] Relation: Spouse  I met face to face with patient and husband in home.   ASSESSMENT AND RECOMMENDATIONS:   Advance Care Planning Today's visit consisted of building trust and discussions on Palliative care medicine as specialized medical care for people living with serious illness, aimed at facilitating improved quality of life through symptoms relief, assisting with advance care planning and establishing goals of care. Patient and husband verbalized appreciation for the education. Palliative care will continue to provide support to patient, family and the medical team. Goal of care: Patient's goal of care is comfort while preserving function . She verbalized desire to regain as much function to lead as normal life as possible. She hoped to return to her pre-covid infection function of walking without assistive device.  Directives: After discussions on ramifications and implications of code status patient elected to be a full Code, reiterating desire for full spectrum of care including mechanical ventilation in the event of cardiac or respiratory arrest. Validation provided. It was also explained to patient that code status will be regularly reviewed to ensure that it reflects her wishes. Patient open to further discussion on code status in the future. The  need to complete a MOST form was also discussed, patient deferred MOST form Completion till when the husband is arround as patient's husband left for a medical appointment mid way through the discussion.  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 Patient report loosing almost 90% of her function after her Covid 19 infection in November 2021. Patient was [facility] [place] [facility] by her Neurologist. Patient is yet to begin services. Patient would benefit from PT/OT/ST. Will follow up with [facility] to confirm service start date. Dysphagia: Patient had swallowing studies completed at [facility], revealed some oropharyngeal dysphagia. Recommendation was for mechanical soft/advanced diet with Nectar thick liquids. Patient denied coughing or spluttering during meals. Patient expressed concern for the quality of her voice, verbalized concerns over loosing her voice. She is pending referral to speech pathologist. Pain: Report right arm pain. Described pain as soreness from her fall yesterday, report pain is relieved with as needed Ibuprofen 448m, FROM noted to right arm, no redness or swelling. Patient to notify PCP if worsening pain or decreased function. Insomina: Currently on Trazodone 261mat bedtime. Report difficulty falling asleep, report ability to sleep thru the night when she finally falls asleep, report falling asleep late and sleeping till about 1pm -2pm. Patient voiced no other concerns at this time. Report breathing is improved with use of Nebulizer at bedtime and as needed during the day. Questions and concerns were addressed. The patient/family was encouraged to call with questions and/or concerns. My business card was provided. Provided general support and encouragement, no other unmet needs identified at this time.  Follow up Palliative Care Visit: Palliative care will continue to follow  for  complex decision making and symptom management. Return in about 4 weeks or prn.  Family /Caregiver/Community Supports: Patient is married to her husband of 2 years. 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 Her husband is her main caregiver. Her step son assist in house chores as able.  Cognitive / Functional decline:  Patient awake, alert and coherent. She has dysartrer -some difficulty understanding her speech, has Choreiform movements related to her Huntington's disease. Report ability to complete her ADLs independently just takes longer time. Currently ambulate with Rolator walker, frequent falls related to gait instability- last fall was yesterday. Report occasional urinary incontinence.   I spent 48 minutes providing this consultation, time includes time spent with patient, chart review, provider coordination, and documentation. More than 50% of the time in this consultation was spent counseling and coordinating communication.   CHIEF COMPLAINT: falls  History obtained from review of EMR and discussion with patient and husband. Records reviewed and summarized bellow.  HISTORY OF PRESENT ILLNESS:  Barbara Gallegos is a 50 y.o. year old female with multiple medical problems including Huntington's disease, pre-diabetes (A1c 5.9 on 11/23/2018). Patient was diagnosed with the disease at age 43 years, she has significant family history for the disease. Patient is followed by Barbara Gallegos with [facility]. She report recently moving to the area after her marriage, she report establishing Primary care services with Dr. Melissa Gallegos with [facility]. Palliative Care was asked to follow this patient to help address advance care planning and goals of care. This is an initial visit.  CODE STATUS: Full Code  PPS: 50%  HOSPICE ELIGIBILITY/DIAGNOSIS: TBD  ROS/staff/patient   Constitutional: denies fever, denies chills EYES: denies acute vision changes ENMT:  denies dysphagia Cardiovascular: denies chest pain, denies palpitation Pulmonary: denies cough, denies increased SOB Abdomen: endorses good appetite, denies constipation, denies incontinence of bowel GU: denies dysuria, endorses occasional incontinence of urine MSK:  endorses ROM limitations, endorsed frequent falls Skin: denies rashes or wounds Neurological: endorses weakness, endorses insomnia Psych: Endorses positive mood Heme/lymph/immuno: denies bruises, abnormal bleeding   Physical Exam: Vital signs: BP 122/90, P 84, RR 18, 97% on room air Current and past weights: 169lbs, Ht 45f4", BMI 29.04kg/m2 on 09/09/2020 General: well appearing, cooperative, sitting on couch in her living room in NAD EYES: anicteric sclera, lids intact, no discharge  ENMT: intact hearing, oral mucous membranes moist CV:  no LE edema Pulmonary: no increased work of breathing, no cough, no audible wheezes, room air Abdomen: no ascites GU: deferred MSK: Choreiform movements noted, no contractures, ambulatory Skin: warm and dry, no rashes or wounds on visible skin Neuro: weakness, A and O x 4 Psych: non-anxious affect today Hem/lymph/immuno: no widespread bruising   PAST MEDICAL HISTORY:  Past Medical History:  Diagnosis Date  . [place] disease [place]   . Prediabetes     SOCIAL HX:  Social History   Tobacco Use  . Smoking status: Never Smoker  . Smokeless tobacco: Never Used  Substance Use Topics  . Alcohol use: Never   FAMILY HX:  Family History  Problem Relation Age of Onset  . Huntington's disease Mother   . Heart disease Father   . Diabetes Maternal Grandmother     ALLERGIES: No Known Allergies   PERTINENT MEDICATIONS:  Outpatient Encounter Medications as of 09/29/2020  Medication Sig  . Deutetrabenazine (AUSTEDO) 12 MG TABS Take 1 tablet by mouth 2 (two) times daily.  . Multiple Vitamin (MULTI-VITAMIN) tablet  Take 1 tablet by mouth daily.  . Norethindrone Acetate-Ethinyl Estrad-FE  (LOESTRIN 24 FE) 1-20 MG-MCG(24) tablet Take by mouth.  . sertraline (ZOLOFT) 100 MG tablet Take 100 mg by mouth daily.  . [DISCONTINUED] omeprazole (PRILOSEC) 40 MG capsule Take 1 capsule (40 mg total) by mouth daily.   No facility-administered encounter medications on file as of 09/29/2020.    Thank you for the opportunity to participate in the care of Ms. Tresea Mall. The palliative care team will continue to follow. Please call our office at [phone] if we can be of additional assistance.   Alba Destine, NP , DNP, AGPCNP-BC

## 2020-09-29 NOTE — Telephone Encounter (Signed)
Last prescribed 12/21/2018  At 03/03/2020 AEX patient declined contraception.

## 2020-09-30 IMAGING — RF DG ESOPHAGUS
9 of 10 series · 14 of 16 positions shown · non-contrast
Comparison: None.

CLINICAL DATA: Dysphagia and GERD.  Huntington's disease

EXAM:
ESOPHOGRAM/BARIUM SWALLOW
TECHNIQUE: Single contrast examination was performed using  thin barium.
FLUOROSCOPY TIME:  Fluoroscopy Time:  2 minutes
Radiation Exposure Index (if provided by the fluoroscopic device):
25.9 mGy
Number of Acquired Spot Images: 0

[Series 1: cp_standard · 0.53mm/px · 3 of 89 frames shown (1 of 9)]
[frame 14/89]
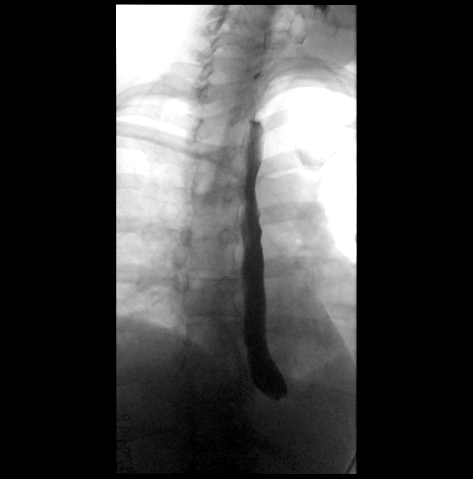
[frame 45/89]
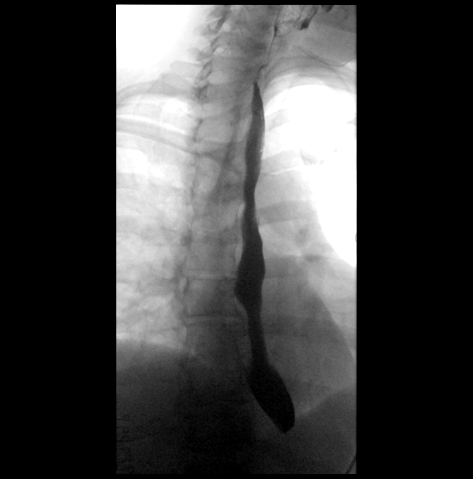
[frame 76/89]
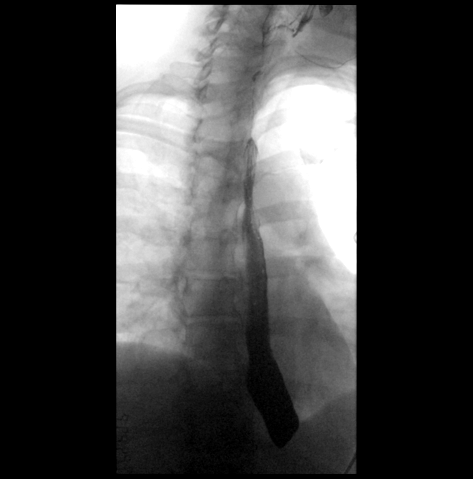

[Series 2: cp_standard · 0.26mm/px · 1 of 1 slices shown (2 of 9)]
[im 1/1]
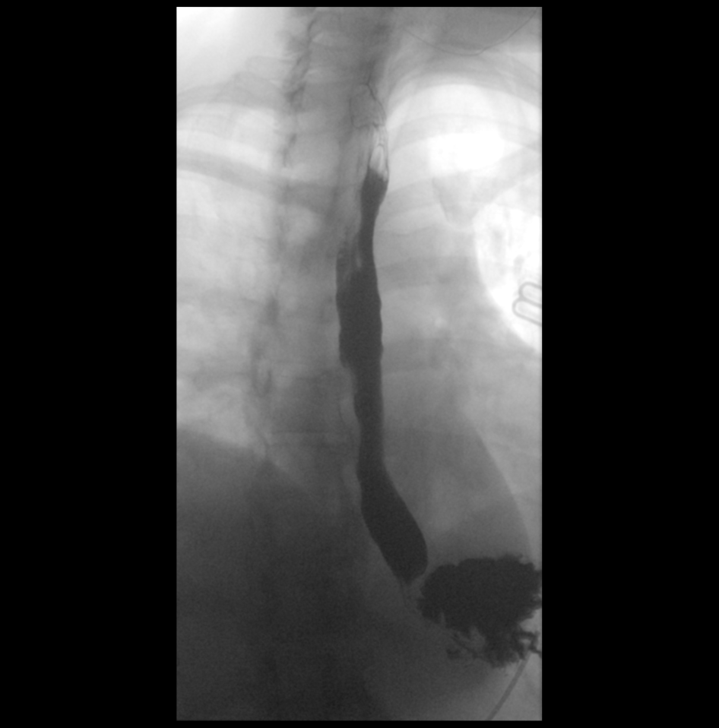

[Series 3: cp_standard · 0.26mm/px · 1 of 1 slices shown (3 of 9)]
[im 1/1]
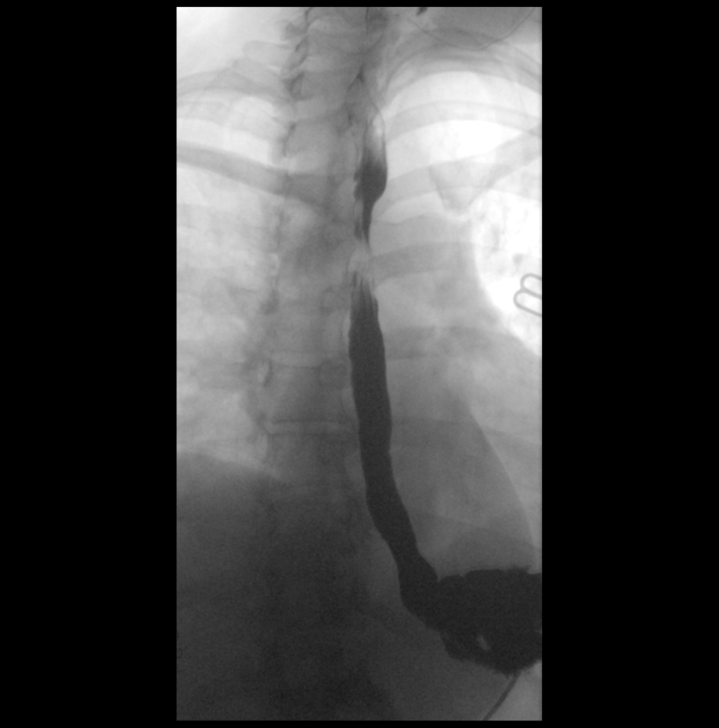

[Series 4: cp_standard · 0.26mm/px · 1 of 1 slices shown (4 of 9)]
[im 1/1]
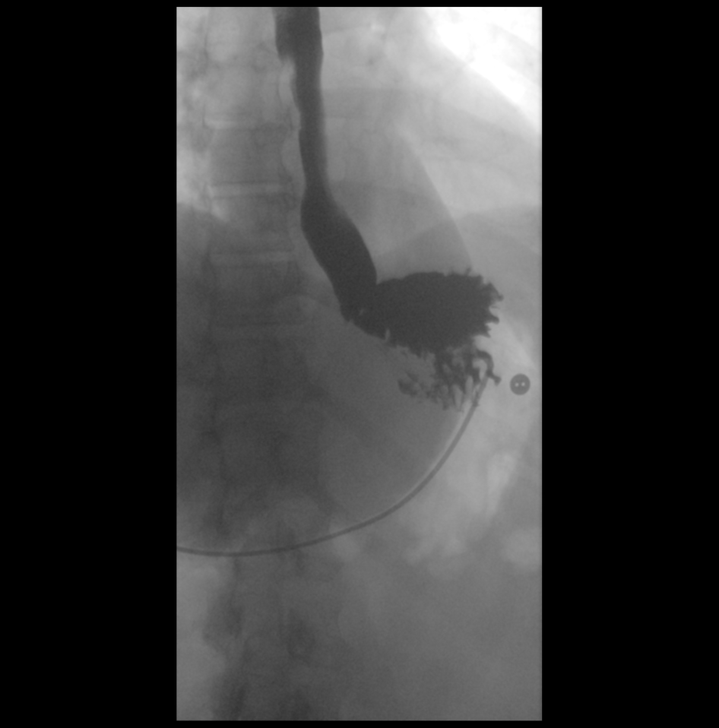

[Series 5: cp_standard · 0.26mm/px · 1 of 1 slices shown (5 of 9)]
[im 1/1]
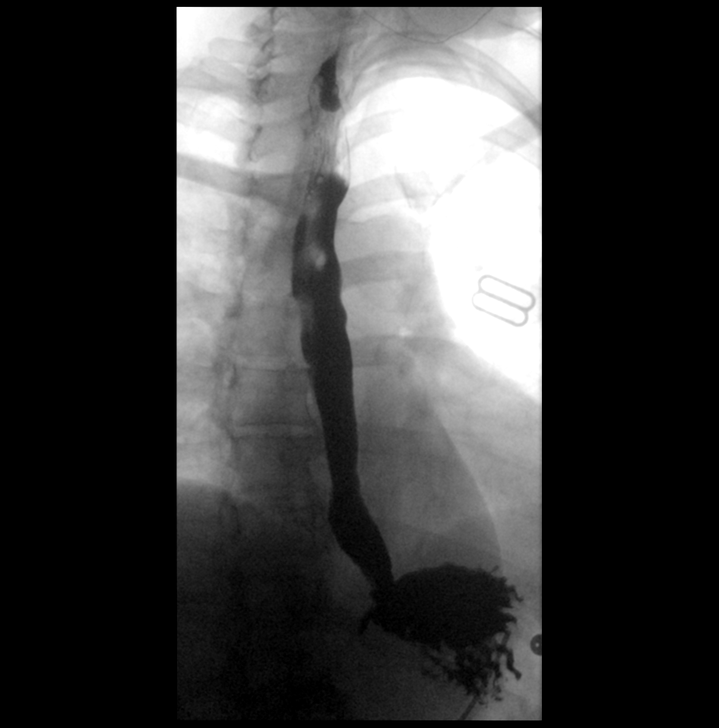

[Series 6: cp_standard · 0.26mm/px · 1 of 1 slices shown (6 of 9)]
[im 1/1]
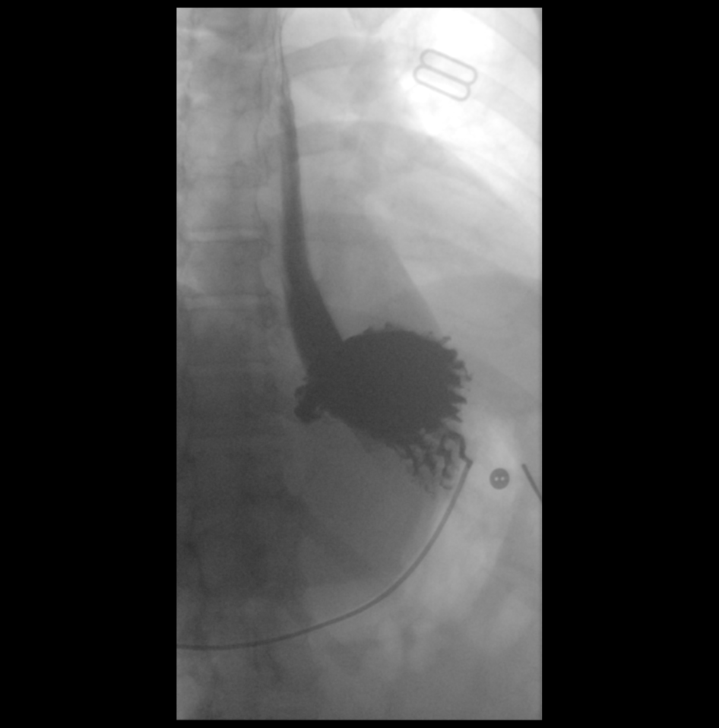

[Series 7: cp_standard · 0.26mm/px · 1 of 1 slices shown (7 of 9)]
[im 1/1]
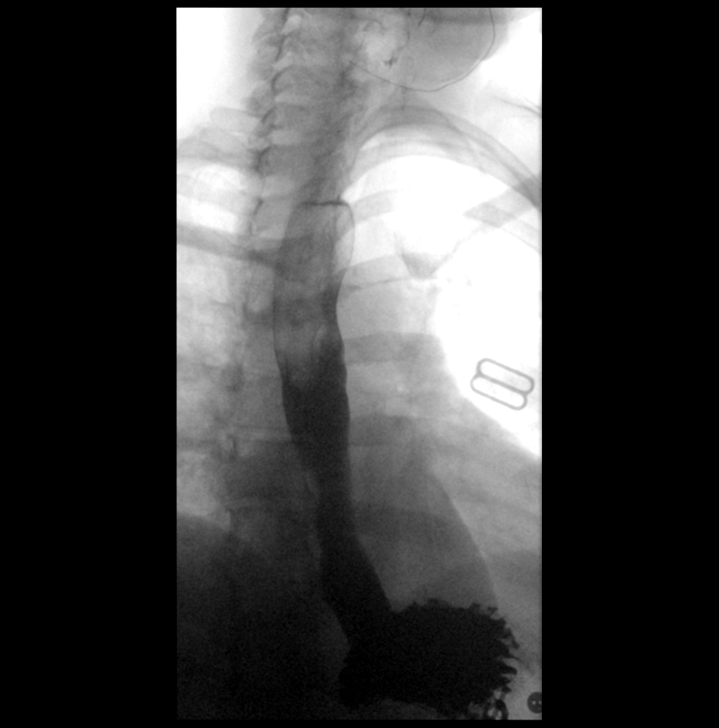

[Series 8: cp_standard · 0.26mm/px · 1 of 1 slices shown (8 of 9)]
[im 1/1]
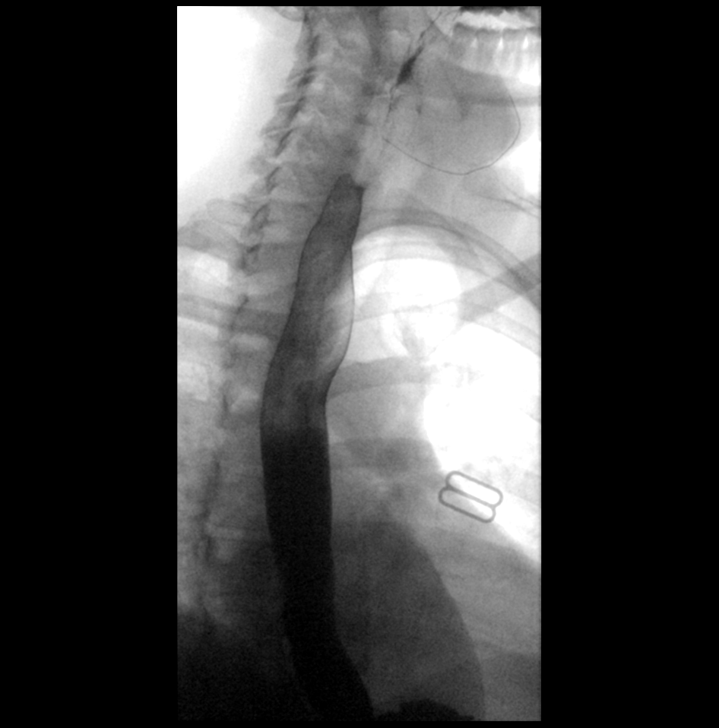

[Series 10: cp_standard · 0.53mm/px · 4 of 127 frames shown (9 of 9)]
[frame 6/127]
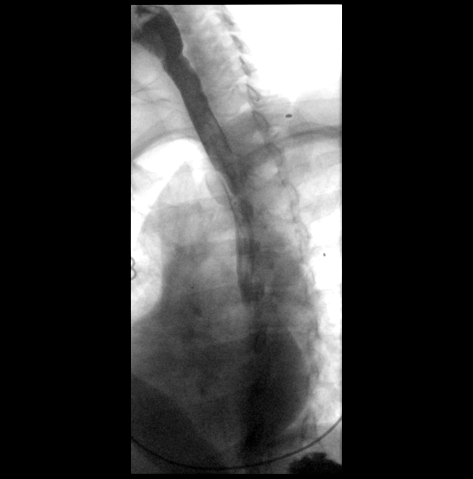
[frame 20/127]
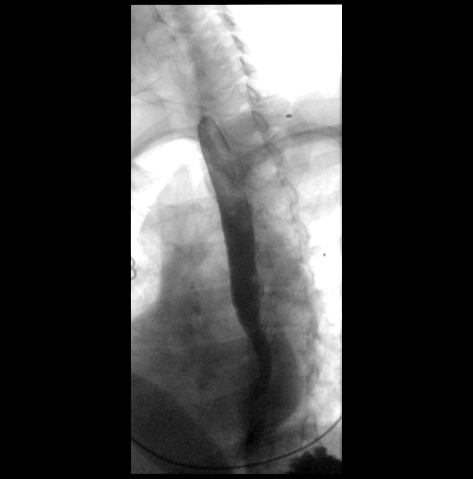
[frame 64/127]
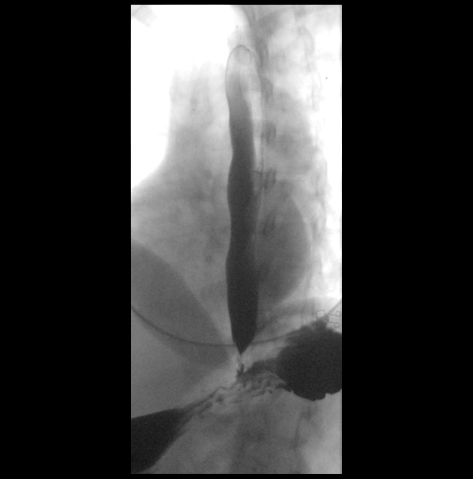
[frame 108/127]
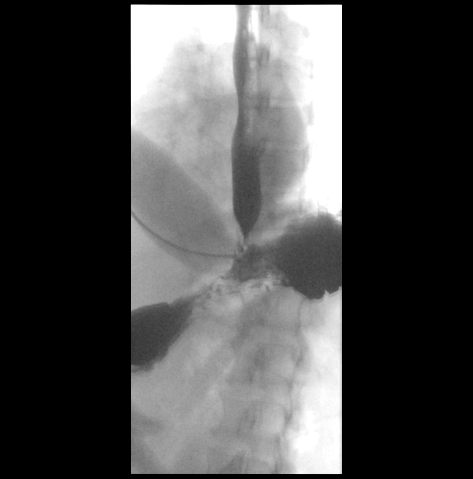

[14 of 16 positions shown; findings below may reference images not displayed]

FINDINGS: A modified examination was performed due to physical limitations of
the patient. Because the patient reported difficulty with standing,
a modified examination was performed with the patient in the semi
upright LPO orientation. Initially the patient was presented with
effervescent crystals followed by thick barium. Patient had
difficulty ingesting the crystals as well as the thick barium.
Subsequently the patient was presented with thin barium which the
patient tolerated.

The esophagus appears patent. There was no stricture or mass
identified. The motility of the esophagus was abnormal. The primary
peristaltic wave was present along with a large static disorganized
peristaltic along the posteromedial wall of the distal half of the
esophagus. The patient deferred ingestion of barium tablet stating
she often chokes on tablets.
IMPRESSION: 1. No stricture or mass identified to explain patient's dysphasia.
2. Moderate esophageal dysmotility.

## 2020-10-07 ENCOUNTER — Other Ambulatory Visit: Payer: Medicare PPO | Admitting: Nurse Practitioner

## 2020-10-23 ENCOUNTER — Telehealth: Payer: Self-pay

## 2020-10-23 NOTE — Telephone Encounter (Signed)
Spoke with [facility] PT with [facility]. Verbal ok provided to add SW to home health services.

## 2020-10-24 ENCOUNTER — Other Ambulatory Visit (HOSPITAL_COMMUNITY): Payer: Self-pay

## 2020-10-24 DIAGNOSIS — R1310 Dysphagia, unspecified: Secondary | ICD-10-CM

## 2020-10-30 ENCOUNTER — Other Ambulatory Visit: Payer: Self-pay

## 2020-10-30 ENCOUNTER — Ambulatory Visit (HOSPITAL_COMMUNITY)
Admission: RE | Admit: 2020-10-30 | Discharge: 2020-10-30 | Disposition: A | Discharge status: Home / Self Care | Payer: Medicare PPO | Source: Ambulatory Visit | Attending: Neurology | Admitting: Neurology

## 2020-10-30 DIAGNOSIS — G10 Huntington's disease: Secondary | ICD-10-CM | POA: Insufficient documentation

## 2020-10-30 DIAGNOSIS — R1310 Dysphagia, unspecified: Secondary | ICD-10-CM | POA: Insufficient documentation

## 2020-10-30 NOTE — Progress Notes (Signed)
Modified Barium Swallow Progress Note  Patient Details  Name: Barbara Gallegos MRN: [id number] Date of Birth: 04-02-1971  Today's Date: 10/30/2020  Modified Barium Swallow completed.  Full report located under Chart Review in the Imaging Section.  Brief recommendations include the following:  Clinical Impression  Pt demonstrates oropharyngeal dysphagia characterized by poor coordination of motor movement for bolus transit and swallow initaition. The pt had adeaute oral strength to form a bolus, but during transit bolus lack cohesion with head of bolus reaching pyriforms with a majority still in oral cavity. Timing of swallow can be accurate or late and can divide and entrap bolus causing residue though strength is adequate. There are instances of trace silent and sensed aspiraiton with nectar and thin liquids. Cues for an intermittent throat clear prevent accumulation of trace aspiration prior to hard coughing. Of greater concern is ill-timed swallow leading to solid spillage or residue with potential for aspiration (seen in particular with pill and mixed textures). Pt already verbalizes an awareness of needing to choose soft cohesive foods, but SLP encouraged additionally chopping and adding sauce prior to attempting solids (Level 5 minced and moist) and also taking pills whole in puree.   Swallow Evaluation Recommendations       SLP Diet Recommendations: Thin liquid (IDDSI level 5 - minced and moist)   Liquid Administration via: Straw   Medication Administration: Whole meds with puree   Supervision: Patient able to self feed   Compensations: Slow rate;Small sips/bites;Clear throat intermittently   Postural Changes: Remain semi-upright after after feeds/meals (Comment);Seated upright at 90 degrees   Oral Care Recommendations: Patient independent with oral care       Harlon Ditty, MA CCC-SLP  Acute Rehabilitation Services Pager [phone] Office [phone]  Claudine Mouton 10/30/2020,1:30 PM

## 2020-11-05 ENCOUNTER — Other Ambulatory Visit: Payer: Medicare PPO | Admitting: Nurse Practitioner

## 2020-11-05 ENCOUNTER — Other Ambulatory Visit: Payer: Self-pay

## 2020-11-06 ENCOUNTER — Telehealth: Payer: Self-pay | Admitting: Nurse Practitioner

## 2020-11-10 ENCOUNTER — Encounter (HOSPITAL_BASED_OUTPATIENT_CLINIC_OR_DEPARTMENT_OTHER): Payer: Self-pay

## 2020-11-10 ENCOUNTER — Emergency Department (HOSPITAL_BASED_OUTPATIENT_CLINIC_OR_DEPARTMENT_OTHER): Payer: Medicare PPO

## 2020-11-10 ENCOUNTER — Other Ambulatory Visit: Payer: Self-pay

## 2020-11-10 ENCOUNTER — Emergency Department (HOSPITAL_BASED_OUTPATIENT_CLINIC_OR_DEPARTMENT_OTHER)
Admission: EM | Admit: 2020-11-10 | Discharge: 2020-11-10 | Disposition: A | Discharge status: Home / Self Care | Payer: Medicare PPO | Attending: Emergency Medicine | Admitting: Emergency Medicine

## 2020-11-10 DIAGNOSIS — S42431A Displaced fracture (avulsion) of lateral epicondyle of right humerus, initial encounter for closed fracture: Secondary | ICD-10-CM | POA: Diagnosis not present

## 2020-11-10 DIAGNOSIS — S59901A Unspecified injury of right elbow, initial encounter: Secondary | ICD-10-CM | POA: Diagnosis present

## 2020-11-10 DIAGNOSIS — M25521 Pain in right elbow: Secondary | ICD-10-CM | POA: Insufficient documentation

## 2020-11-10 DIAGNOSIS — Y92009 Unspecified place in unspecified non-institutional (private) residence as the place of occurrence of the external cause: Secondary | ICD-10-CM | POA: Diagnosis not present

## 2020-11-10 DIAGNOSIS — S42401A Unspecified fracture of lower end of right humerus, initial encounter for closed fracture: Secondary | ICD-10-CM

## 2020-11-10 DIAGNOSIS — W0110XA Fall on same level from slipping, tripping and stumbling with subsequent striking against unspecified object, initial encounter: Secondary | ICD-10-CM | POA: Insufficient documentation

## 2020-11-10 MED ORDER — OXYCODONE-ACETAMINOPHEN 5-325 MG PO TABS
1.0000 | ORAL_TABLET | Freq: Once | ORAL | Status: AC
  Administered 2020-11-10: 1 via ORAL
  Filled 2020-11-10: qty 1

## 2020-11-10 MED ORDER — OXYCODONE HCL 5 MG PO TABS
5.0000 mg | ORAL_TABLET | ORAL | 0 refills | Status: AC | PRN
Start: 2020-11-10 — End: 2020-11-13

## 2020-11-10 NOTE — Discharge Instructions (Signed)
Recommend following up with your orthopedic surgeon regarding your elbow fracture.  Recommend no weightbearing and using sling and splint as discussed until further instructed by Ortho.  Recommend Tylenol or Motrin for pain control.  For breakthrough pain can take the prescribed oxycodone.

## 2020-11-10 NOTE — ED Provider Notes (Signed)
[facility] EMERGENCY DEPT Provider Note   CSN: [id number] Arrival date & time: 11/10/20  1929     History Chief Complaint  Patient presents with  . Fall    R. Arm Injury    Barbara Gallegos is a 50 y.o. female. Presents to ER with concern for arm injury.  Patient fell at home.  Hit right elbow.  Denies trauma elsewhere.  No head trauma, no loss of consciousness.  Pain is isolated to right elbow, worse with movement, currently moderate.  No numbness or weakness.  States that she had previously injured this elbow. HPI     Past Medical History:  Diagnosis Date  . Huntington disease (HCC)   . Prediabetes     There are no problems to display for this patient.   Past Surgical History:  Procedure Laterality Date  . HAND SURGERY    . HAND SURGERY       OB History    Gravida  2   Para  2   Term      Preterm      AB      Living  2     SAB      IAB      Ectopic      Multiple      Live Births              Family History  Problem Relation Age of Onset  . Huntington's disease Mother   . Heart disease Father   . Diabetes Maternal Grandmother     Social History   Tobacco Use  . Smoking status: Never Smoker  . Smokeless tobacco: Never Used  Vaping Use  . Vaping Use: Never used  Substance Use Topics  . Alcohol use: Never  . Drug use: Never    Home Medications Prior to Admission medications   Medication Sig Start Date End Date Taking? Authorizing Provider  oxyCODONE (ROXICODONE) 5 MG immediate release tablet Take 1 tablet (5 mg total) by mouth every 4 (four) hours as needed for up to 3 days for severe pain. 11/10/20 11/13/20 Yes Warrene Kapfer, Quitman Livings, MD  AUROVELA 24 FE 1-20 MG-MCG(24) tablet Take 1 tablet by mouth once daily 10/02/20   Genia Del, MD  Deutetrabenazine (AUSTEDO) 12 MG TABS Take 1 tablet by mouth 2 (two) times daily.    [provider]  Multiple Vitamin (MULTI-VITAMIN) tablet Take 1 tablet by mouth daily.     [provider]  sertraline (ZOLOFT) 100 MG tablet Take 100 mg by mouth daily. 02/25/20   [provider]  omeprazole (PRILOSEC) 40 MG capsule Take 1 capsule (40 mg total) by mouth daily. 07/27/19 05/27/20  Rachael Fee, MD    Allergies    Patient has no known allergies.  Review of Systems   Review of Systems  Constitutional: Negative for chills and fever.  HENT: Negative for ear pain and sore throat.   Eyes: Negative for pain and visual disturbance.  Respiratory: Negative for cough and shortness of breath.   Cardiovascular: Negative for chest pain and palpitations.  Gastrointestinal: Negative for abdominal pain and vomiting.  Genitourinary: Negative for dysuria and hematuria.  Musculoskeletal: Positive for arthralgias. Negative for back pain.  Skin: Negative for color change and rash.  Neurological: Negative for seizures and syncope.  All other systems reviewed and are negative.   Physical Exam Updated Vital Signs BP (!) 122/57 (BP Location: Right Arm)   Pulse 82   Temp 99.3 F (37.4  C) (Oral)   Resp 18   Ht 5\' 5"  (1.651 m)   Wt 72.6 kg   LMP 10/16/2020   SpO2 99%   BMI 26.63 kg/m   Physical Exam Vitals and nursing note reviewed.  Constitutional:      General: She is not in acute distress.    Appearance: She is well-developed.  HENT:     Head: Normocephalic and atraumatic.  Eyes:     Conjunctiva/sclera: Conjunctivae normal.  Cardiovascular:     Rate and Rhythm: Normal rate and regular rhythm.     Heart sounds: No murmur heard.   Pulmonary:     Effort: Pulmonary effort is normal. No respiratory distress.     Breath sounds: Normal breath sounds.  Abdominal:     Palpations: Abdomen is soft.     Tenderness: There is no abdominal tenderness.  Musculoskeletal:     Cervical back: Neck supple.     Comments: Right upper extremity: There is no tenderness today, mild swelling noted, deformity noted, normal sensation,, radial pulse intact  Skin:     General: Skin is warm and dry.  Neurological:     Mental Status: She is alert.     ED Results / Procedures / Treatments   Labs (all labs ordered are listed, but only abnormal results are displayed) Labs Reviewed - No data to display  EKG None  Radiology DG Elbow Complete Right  Result Date: 11/10/2020 CLINICAL DATA:  RIGHT elbow pain after fall. EXAM: RIGHT ELBOW - COMPLETE 3+ VIEW COMPARISON:  None. FINDINGS: Small ossific fragment lateral to the lateral epicondyle no dislocation. Node joint effusion. IMPRESSION: Avulsion fracture of the lateral epicondyle. Electronically Signed   By: 11/12/2020 M.D.   On: 11/10/2020 21:31    Procedures Procedures   Medications Ordered in ED Medications  oxyCODONE-acetaminophen (PERCOCET/ROXICET) 5-325 MG per tablet 1 tablet (1 tablet Oral Given 11/10/20 2059)  oxyCODONE-acetaminophen (PERCOCET/ROXICET) 5-325 MG per tablet 1 tablet (1 tablet Oral Given 11/10/20 2237)    ED Course  I have reviewed the triage vital signs and the nursing notes.  Pertinent labs & imaging results that were available during my care of the patient were reviewed by me and considered in my medical decision making (see chart for details).    MDM Rules/Calculators/A&P                         50 year old lady presents to ER with concern for right elbow pain after mechanical fall.  Plain film concerning for small ossific fragment lateral to lateral epicondyles, likely avulsion fracture.  Neurovascularly intact.  Placed in a posterior long-arm splint.  Instructed to follow-up with her orthopedic office where she is existing patient with [facility] to discuss further care, ideally to be seen within the next few days.  Recommended nonweightbearing and sling for now until further instructed by Ortho.   After the discussed management above, the patient was determined to be safe for discharge.  The patient was in agreement with this plan and all questions regarding their care  were answered.  ED return precautions were discussed and the patient will return to the ED with any significant worsening of condition.   Final Clinical Impression(s) / ED Diagnoses Final diagnoses:  Closed fracture of right elbow, initial encounter    Rx / DC Orders ED Discharge Orders         Ordered    oxyCODONE (ROXICODONE) 5 MG immediate release tablet  Every  4 hours PRN        11/10/20 2227           Milagros Loll, MD 11/11/20 [phone]

## 2020-11-10 NOTE — ED Triage Notes (Signed)
Patient here POV from Home with Mechanical Fell.   Patient fell during lunch time. Patient utilized Environmental consultant at [facility] and tripped over herself.   Ambulatory, GCS 15. Visitor with Patient. Patient does receive Home Health Care and RN responded.   Patient complaining of Pain to R. Arm.

## 2020-11-27 ENCOUNTER — Emergency Department (HOSPITAL_BASED_OUTPATIENT_CLINIC_OR_DEPARTMENT_OTHER): Payer: Medicare PPO

## 2020-11-27 ENCOUNTER — Encounter (HOSPITAL_BASED_OUTPATIENT_CLINIC_OR_DEPARTMENT_OTHER): Payer: Self-pay

## 2020-11-27 ENCOUNTER — Emergency Department (HOSPITAL_BASED_OUTPATIENT_CLINIC_OR_DEPARTMENT_OTHER)
Admission: EM | Admit: 2020-11-27 | Discharge: 2020-11-27 | Disposition: A | Discharge status: Home / Self Care | Payer: Medicare PPO | Attending: Emergency Medicine | Admitting: Emergency Medicine

## 2020-11-27 ENCOUNTER — Other Ambulatory Visit: Payer: Self-pay

## 2020-11-27 DIAGNOSIS — R296 Repeated falls: Secondary | ICD-10-CM

## 2020-11-27 DIAGNOSIS — S0990XA Unspecified injury of head, initial encounter: Secondary | ICD-10-CM | POA: Insufficient documentation

## 2020-11-27 DIAGNOSIS — W01198A Fall on same level from slipping, tripping and stumbling with subsequent striking against other object, initial encounter: Secondary | ICD-10-CM | POA: Insufficient documentation

## 2020-11-27 DIAGNOSIS — S0090XA Unspecified superficial injury of unspecified part of head, initial encounter: Secondary | ICD-10-CM

## 2020-11-27 NOTE — ED Notes (Signed)
Patient returned from CT at this Time. ?

## 2020-11-27 NOTE — ED Notes (Signed)
Patient transported to CT at this Time. ?

## 2020-11-27 NOTE — ED Provider Notes (Signed)
[facility]  CSN: [id number] Arrival date & time: 11/27/20 0117  Chief Complaint(s) Fall  HPI Barbara Gallegos is a 50 y.o. female with a past medical history of Huntington's disease and recurrent falls over the past several months who presents to the emergency department for evaluation after fall at home that occurred several days ago.  Patient and husband report that she has stress-induced syncopal episodes.  Believe that that was the cause of the fall several days ago.  Fall resulted in occipital head injury.  She reports falling backwards and hitting the door of the oven breaking the glass.  She did not sustain any significant injuries during that time.  Has.  Reports some cognitive changes including some mild memory issues that have worsened since the fall.  Patient endorsed intermittent headache since the fall.  They called her neurologist who ordered a CT head to r/o bleed, but scan is weeks away.  HPI  Past Medical History Past Medical History:  Diagnosis Date  . Huntington disease (HCC)   . Prediabetes    There are no problems to display for this patient.  Home Medication(s) Prior to Admission medications   Medication Sig Start Date End Date Taking? Authorizing Provider  AUROVELA 24 FE 1-20 MG-MCG(24) tablet Take 1 tablet by mouth once daily 10/02/20   Genia DelLavoie, Marie-Lyne, MD  Deutetrabenazine (AUSTEDO) 12 MG TABS Take 1 tablet by mouth 2 (two) times daily.    [provider]  Multiple Vitamin (MULTI-VITAMIN) tablet Take 1 tablet by mouth daily.    [provider]  sertraline (ZOLOFT) 100 MG tablet Take 100 mg by mouth daily. 02/25/20   [provider]  omeprazole (PRILOSEC) 40 MG capsule Take 1 capsule (40 mg total) by mouth daily. 07/27/19 05/27/20  Rachael FeeJacobs, Daniel P, MD                                                                                                                                    Past Surgical  History Past Surgical History:  Procedure Laterality Date  . HAND SURGERY    . HAND SURGERY     Family History Family History  Problem Relation Age of Onset  . Huntington's disease Mother   . Heart disease Father   . Diabetes Maternal Grandmother     Social History Social History   Tobacco Use  . Smoking status: Never Smoker  . Smokeless tobacco: Never Used  Vaping Use  . Vaping Use: Never used  Substance Use Topics  . Alcohol use: Never  . Drug use: Never   Allergies Patient has no known allergies.  Review of Systems Review of Systems All other systems are reviewed and are negative for acute change except as noted in the HPI  Physical Exam Vital Signs  I have reviewed the triage vital signs BP (!) 115/47   Pulse 83   Temp 98.8 F (37.1 C) (  Oral)   Resp 18   Ht 5\' 5"  (1.651 m)   Wt 72.6 kg   SpO2 100%   BMI 26.63 kg/m   Physical Exam Vitals reviewed.  Constitutional:      General: She is not in acute distress.    Appearance: She is well-developed. She is not diaphoretic.  HENT:     Head: Normocephalic and atraumatic.     Right Ear: External ear normal.     Left Ear: External ear normal.     Nose: Nose normal.  Eyes:     General: No scleral icterus.    Conjunctiva/sclera: Conjunctivae normal.  Neck:     Trachea: Phonation normal.  Cardiovascular:     Rate and Rhythm: Normal rate and regular rhythm.  Pulmonary:     Effort: Pulmonary effort is normal. No respiratory distress.     Breath sounds: No stridor.  Abdominal:     General: There is no distension.  Musculoskeletal:        General: Normal range of motion.     Cervical back: Normal range of motion.  Neurological:     Mental Status: She is alert.     Comments: Mental Status: .  Attention and concentration slightly slowed.  Speech dysarthric.    Cranial Nerves:  II Visual Fields: Intact to confrontation. Visual fields intact. III, IV, VI: Pupils equal and reactive to light and near.  Full eye movement without nystagmus  V Facial Sensation: Normal. No weakness of masticatory muscles  VII: No facial weakness or asymmetry  VIII Auditory Acuity: Grossly normal  IX/X: The uvula is midline; the palate elevates symmetrically  XI: Normal sternocleidomastoid and trapezius strength  XII: The tongue is midline. No atrophy or fasciculations.   Motor System: Muscle Strength: 5/5 and symmetric in the upper and lower extremities. Generalized chorea  Muscle Tone: Tone and muscle bulk are normal in the upper and lower extremities.   Coordination:  No tremor.  Sensation: Intact to light touch.  Gait: deferred   Psychiatric:        Behavior: Behavior normal.     ED Results and Treatments Labs (all labs ordered are listed, but only abnormal results are displayed) Labs Reviewed - No data to display                                                                                                                       EKG  EKG Interpretation  Date/Time:    Ventricular Rate:    PR Interval:    QRS Duration:   QT Interval:    QTC Calculation:   R Axis:     Text Interpretation:        Radiology CT Head Wo Contrast  Result Date: 11/27/2020 CLINICAL DATA:  50 year old female with neck trauma. EXAM: CT HEAD WITHOUT CONTRAST CT CERVICAL SPINE WITHOUT CONTRAST TECHNIQUE: Multidetector CT imaging of the head and cervical spine was performed following the standard protocol without intravenous contrast. Multiplanar CT image reconstructions  of the cervical spine were also generated. COMPARISON:  Head CT dated 07/22/2019. FINDINGS: CT HEAD FINDINGS Brain: Bifrontal cortical atrophy. The ventricles are appropriate size for patient's age. The gray-white matter discrimination is preserved. There is no acute intracranial hemorrhage. No mass effect or midline shift. No extra-axial fluid collection. Vascular: No hyperdense vessel or unexpected calcification. Skull: Normal. Negative for fracture or  focal lesion. Sinuses/Orbits: No acute finding. Other: Scalp contusion or scarring over the vertex. CT CERVICAL SPINE FINDINGS Alignment: No acute subluxation. There is reversal of normal cervical lordosis which may be positional or due to muscle spasm. Skull base and vertebrae: No acute fracture. Soft tissues and spinal canal: No prevertebral fluid or swelling. No visible canal hematoma. Disc levels: Multilevel degenerative changes with endplate irregularity and disc space narrowing and spurring most prominent at C5-C6 and C6-C7. Multilevel facet arthropathy. Upper chest: Negative. Other: None IMPRESSION: 1. No acute intracranial pathology. Bifrontal atrophy advanced for patient's age. 2. No acute/traumatic cervical spine pathology. Multilevel degenerative changes. Electronically Signed   By: Elgie Collard M.D.   On: 11/27/2020 02:35   CT Cervical Spine Wo Contrast  Result Date: 11/27/2020 CLINICAL DATA:  50 year old female with neck trauma. EXAM: CT HEAD WITHOUT CONTRAST CT CERVICAL SPINE WITHOUT CONTRAST TECHNIQUE: Multidetector CT imaging of the head and cervical spine was performed following the standard protocol without intravenous contrast. Multiplanar CT image reconstructions of the cervical spine were also generated. COMPARISON:  Head CT dated 07/22/2019. FINDINGS: CT HEAD FINDINGS Brain: Bifrontal cortical atrophy. The ventricles are appropriate size for patient's age. The gray-white matter discrimination is preserved. There is no acute intracranial hemorrhage. No mass effect or midline shift. No extra-axial fluid collection. Vascular: No hyperdense vessel or unexpected calcification. Skull: Normal. Negative for fracture or focal lesion. Sinuses/Orbits: No acute finding. Other: Scalp contusion or scarring over the vertex. CT CERVICAL SPINE FINDINGS Alignment: No acute subluxation. There is reversal of normal cervical lordosis which may be positional or due to muscle spasm. Skull base and vertebrae:  No acute fracture. Soft tissues and spinal canal: No prevertebral fluid or swelling. No visible canal hematoma. Disc levels: Multilevel degenerative changes with endplate irregularity and disc space narrowing and spurring most prominent at C5-C6 and C6-C7. Multilevel facet arthropathy. Upper chest: Negative. Other: None IMPRESSION: 1. No acute intracranial pathology. Bifrontal atrophy advanced for patient's age. 2. No acute/traumatic cervical spine pathology. Multilevel degenerative changes. Electronically Signed   By: Elgie Collard M.D.   On: 11/27/2020 02:35    Pertinent labs & imaging results that were available during my care of the patient were reviewed by me and considered in my medical decision making (see chart for details).  Medications Ordered in ED Medications - No data to display                                                                                                                                  Procedures Procedures  (  including critical care time)  Medical Decision Making / ED Course I have reviewed the nursing notes for this encounter and the patient's prior records (if available in EHR or on provided paperwork).   Shikira Folino was evaluated in Emergency Department on 11/27/2020 for the symptoms described in the history of present illness. She was evaluated in the context of the global COVID-19 pandemic, which necessitated consideration that the patient might be at risk for infection with the SARS-CoV-2 virus that causes COVID-19. Institutional protocols and algorithms that pertain to the evaluation of patients at risk for COVID-19 are in a state of rapid change based on information released by regulatory bodies including the CDC and federal and state organizations. These policies and algorithms were followed during the patient's care in the ED.  Fall at home. Recurrent for several months Neuro exam is consistent with HD. No focal deficits concerning for CVA. CT  head and cervical spine ordered to r/o ICH or injury from fall.  CT negative for acute injuries. Neurology follow up already scheduled.      Final Clinical Impression(s) / ED Diagnoses Final diagnoses:  Recurrent falls  Minor closed head injury   The patient appears reasonably screened and/or stabilized for discharge and I doubt any other medical condition or other [facility] requiring further screening, evaluation, or treatment in the ED at this time prior to discharge. Safe for discharge with strict return precautions.  Disposition: Discharge  Condition: Good  I have discussed the results, Dx and Tx plan with the patient/family who expressed understanding and agree(s) with the plan. Discharge instructions discussed at length. The patient/family was given strict return precautions who verbalized understanding of the instructions. No further questions at time of discharge.    ED Discharge Orders    None      Follow Up: Stevphen Rochester, MD [address] [place] [address] [phone]     Johny Blamer, MD [address] [place] [address] [phone]  Schedule an appointment as soon as possible for a visit        This chart was dictated using voice recognition software.  Despite best efforts to proofread,  errors can occur which can change the documentation meaning.   Nira Conn, MD 11/27/20 [phone]

## 2020-11-27 NOTE — ED Triage Notes (Addendum)
Patient here POV from Home.   Patient fell past Friday Night. Patient had fell backwards hitting her Posterior Head onto Oven Door. Glass broke but there was no obvious Trauma besides a few scratches to the Upper Back.   Since then however the Patient has been having continued Pain to Posterior Head, Nausea, Mild Disorientation and Imbalance. Patient has also had 2-3 Minor Falls since.  Patient scheduled to have Outpatient CT Scan in Early June but due to worsening symptoms patient came today.  NAD, GCS 15, BIB Wheelchair.

## 2020-12-04 ENCOUNTER — Telehealth: Payer: Self-pay

## 2020-12-04 NOTE — Telephone Encounter (Signed)
Spoke with patient's husband to check in and offer to schedule fu visit with Palliative NP. Husband shared that patient is feeling better. Patient was dehydrated and has increased fluid intake with thickener. Patient is able to tolerate that well.Scheduled follow up with Palliative NP for 12/16/20

## 2020-12-16 ENCOUNTER — Other Ambulatory Visit: Payer: Medicare PPO | Admitting: Nurse Practitioner

## 2020-12-16 ENCOUNTER — Other Ambulatory Visit: Payer: Self-pay

## 2020-12-16 VITALS — BP 122/70 | HR 78

## 2020-12-16 DIAGNOSIS — R296 Repeated falls: Secondary | ICD-10-CM

## 2020-12-16 DIAGNOSIS — Z515 Encounter for palliative care: Secondary | ICD-10-CM

## 2020-12-16 DIAGNOSIS — M6281 Muscle weakness (generalized): Secondary | ICD-10-CM

## 2020-12-16 NOTE — Progress Notes (Signed)
[facility] Consult Note Telephone: [phone]  Fax: [phone]    Date of encounter: 12/16/20 PATIENT NAME: Barbara Gallegos [address] [place] [address]   [phone] (home)  DOB: 02/10/1971 MRN: [id number]  PRIMARY CARE PROVIDER:    Jolinda Croak, MD,  [place] [address] [phone]  REFERRING PROVIDER:   Jolinda Croak, MD [address],  [place] [address] [phone]  RESPONSIBLE PARTY:    Contact Information    Name Relation Home Work Mobile   [place] Spouse [phone]       I met face to face with patient in home, her husband participated via telephone. Palliative Care was asked to follow this patient by consultation request of  Jolinda Croak, MD to address advance care planning and complex medical decision making. This is a follow up visit.                                   ASSESSMENT AND PLAN / RECOMMENDATIONS:   Advance Care Planning/Goals of Care:  CODE STATUS: Full code Goal of care: Patient's goal of care is comfort while preserving function.  Symptom Management/Plan: Generalized weakness: Generalized weakness related to progression of Huntington's disease. Husband asked if palliative care provides personal care aides, made aware that palliative care does not provide personal care aide but may provide list of agency that provides such services, made aware that the providers are not in anyway associated with [facility] collective. Husband will call patient's insurance to discuss possibility of insurance covering some of the cost of a personal caregiver. Patient report being discharged from physical therapy. Will reach out to [facility] to confirm reason for discharge, patient may need ongoing physical therapy. Continue supportive care, maintain safety. Assist with ADLs while encouraging patient to do as much as possible for self.   Insomnia: Patient report staying up late at night, goes to sleep between 1am and 2am, wakes up at about noon. Patient encouraged to go to bed early, to promote better sleep/wake cycle.  Follow up Palliative Care Visit: Palliative care will continue to follow for complex medical decision making, advance care planning, and clarification of goals. Return in about 4 weeks or prn.  PPS: 50%  HOSPICE ELIGIBILITY/DIAGNOSIS: TBD  CHIEF COMPLAIN: Generalized weakness  History obtained from review of Epic EMR and discussion with [name] and her husband via telephone.   HISTORY OF PRESENT ILLNESS:  Barbara Gallegos is a 50 y.o. year old female with multiple medical problems including Huntington's disease (HD), pre-diabetes (A1c 5.9 on 11/23/2018). Patient was diagnosed with Huntington's disease at age 66 years, she has significant family history for the disease. Patient is followed by Dr. Jennelle Human with [facility] Neurology.  Patient with complaint of ongoing generalized weakness in the context of progression of HD. Weakness is associated with frequent falls, report reduction in falls since she started increasing her fluid intake. Report having only one fall in the last month, when she lost balance while walking on a wet floor in her home. Patient report being discharged from physical therapy 3 weeks ago, report unawareness of reason for discontinuation saying she felt she was doing well with therapy. She currently ambulates independently with a Rolator walker, gait unsteady. She however depends on her husband for most of her ADLs including bathing and preparation of her meals.  She report feeling well today, denied fever, chills, SOB, cough, or any uncontrolled pain. Ten systems reviewed and are negative for acute change, except as noted in the HPI.   I reviewed available labs, medications, imaging, studies and related documents from the EMR.  Records reviewed and summarized above.   Physical  Exam: General: well appearing, cooperative, sitting on couch in her living room in NAD EYES: anicteric sclera, no discharge  ENMT: intact hearing, oral mucous membranes moist CV: no LE edema Pulmonary: no increased work of breathing, no cough, no audible wheezes, room air Abdomen:no ascites GU: deferred JSR:PRXYVO choreiform movements noted, no contractures, ambulatory, brace noted to right arm from prior fall Skin: warm and dry, no rashes or wounds on visible skin Neuro: weakness, A and O x 4 Psych: non-anxious affecttoday Hem/lymph/immuno: no widespread bruising  Past Medical History:  Diagnosis Date  . Huntington disease ([place])   . Prediabetes    Current Outpatient Medications on File Prior to Visit  Medication Sig Dispense Refill  . AUROVELA 24 FE 1-20 MG-MCG(24) tablet Take 1 tablet by mouth once daily 84 tablet 1  . Deutetrabenazine (AUSTEDO) 12 MG TABS Take 1 tablet by mouth 2 (two) times daily.    . Multiple Vitamin (MULTI-VITAMIN) tablet Take 1 tablet by mouth daily.    . sertraline (ZOLOFT) 100 MG tablet Take 100 mg by mouth daily.    . [DISCONTINUED] omeprazole (PRILOSEC) 40 MG capsule Take 1 capsule (40 mg total) by mouth daily. 30 capsule 2   No current facility-administered medications on file prior to visit.   Thank you for the opportunity to participate in the care of Barbara Gallegos.  The palliative care team will continue to follow. Please call our office at [phone] if we can be of additional assistance.   Andranik Jeune. DNP, AGPCNP-BC  COVID-19 PATIENT SCREENING TOOL Asked and negative response unless otherwise noted:   Have you had symptoms of covid, tested positive or been in contact with someone with symptoms/positive test in the past 5-10 days?

## 2021-01-15 ENCOUNTER — Other Ambulatory Visit: Payer: Self-pay

## 2021-01-15 ENCOUNTER — Other Ambulatory Visit: Payer: Medicare PPO | Admitting: Nurse Practitioner

## 2021-01-15 VITALS — BP 110/60 | HR 94 | Resp 18

## 2021-01-15 DIAGNOSIS — R1312 Dysphagia, oropharyngeal phase: Secondary | ICD-10-CM

## 2021-01-15 DIAGNOSIS — R296 Repeated falls: Secondary | ICD-10-CM

## 2021-01-15 DIAGNOSIS — Z515 Encounter for palliative care: Secondary | ICD-10-CM

## 2021-01-15 NOTE — Progress Notes (Signed)
[facility] Consult Note Telephone: [phone]  Fax: [phone]    Date of encounter: 01/15/21 PATIENT NAME: Barbara Gallegos [address] [place] [address]   [phone] (home)  DOB: 27-Apr-1971 MRN: [id number]  PRIMARY CARE PROVIDER:    Jolinda Croak, MD,  [place] [address] [phone]  REFERRING PROVIDER:   Jolinda Croak, MD [place],  [place] [address] [phone]  RESPONSIBLE PARTY:    Contact Information     Name Relation Home Work Mobile   [place] Spouse [phone]       I met face to face with patient in home. Palliative Care was asked to follow this patient by consultation request of  Barbara Croak, MD to address advance care planning and complex medical decision making. This is a follow up visit.                                  ASSESSMENT AND PLAN / RECOMMENDATIONS:   Advance Care Planning/Goals of Care:  CODE STATUS: Full code Goal of care: Patient's goal of care is comfort while preserving function.  Symptom Management/Plan: Frequent fall: Falls related to movements and unsteady gait related to worsening Huntington's disease. Patient fell twice in the last month. Patient is pending physical therapy by therapist from [facility], she is followed by HD clinic. Continue current plan of care. Patient instructed to seek care in hospital if worsening hip pain or decline in function. Patient stays home by herself during the day when her husband is at work. Her son and her step son present home an assist as needed. Dysphagia: oropharyngeal dysphagia. Patient not complaint with recommended mechanical soft diet diet with nectar thick fluid. She continues to eat dry chips and cookies. Encouraged compliance with diet. Reviewed safe swallowing practices like sitting upright 30 minutes after eating, taking small bites/sips during  meals. Provided general support and encouragement. Questions and concerns were addressed. Patient and family was encouraged to call with questions and/or concerns.  Follow up Palliative Care Visit: Palliative care will continue to follow for complex medical decision making, advance care planning, and clarification of goals. Return in about 6-8 weeks or prn.  PPS: 50%  HOSPICE ELIGIBILITY/DIAGNOSIS: TBD  CHIEF COMPLAIN: frequent falls  History obtained from review of Epic EMR and discussion with Barbara Gallegos and her husband.  HISTORY OF PRESENT ILLNESS:  Barbara Gallegos is a 50 y.o. year old female with report of frequent falls secondary to choreiform movements related to Huntington's disease. Last fall was 5 days ago when she fell on her right lateral hip and sustained a bump. The bump is currently resolved. She report a 5/10 pain at the site, report pain is relieved by Ibuprofen 459m twice a day. She however has FROM to bilateral hips. She however report feeling well overall. She continues to ambulate with a Rolator walker. She denied fever, denied chills, denied SOB, denied chest pain. Ten systems reviewed and are negative for acute change, except as noted in the HPI.    I reviewed available labs, medications, imaging, studies and related documents from the EMR.  Records reviewed and summarized above.   Vitals:   01/15/21 1601  BP: 110/60  Pulse: 94  Resp: 18  SpO2: 97%    Physical Exam: General: well appearing, cooperative, sitting on couch in her living  room in NAD EYES: anicteric sclera, no discharge ENMT: intact hearing, oral mucous membranes moist CV:  no LE edema Pulmonary: no increased work of breathing, no cough, no audible wheezes, room air Abdomen: no ascites GU: deferred MSK: severe choreiform movements noted, no contractures, ambulatory, brace noted to right arm from prior fall Skin: warm and dry, mild bruise noted to right knee Neuro: weakness, A and O x 4 Psych:  non-anxious affect today Hem/lymph/immuno: no widespread bruising  Past Medical History:  Diagnosis Date   Huntington disease ([place])    Prediabetes     Thank you for the opportunity to participate in the care of Ms. Barbara Gallegos.  The palliative care team will continue to follow. Please call our office at [phone] if we can be of additional assistance.   Barbara Favre, DNP, AGPCNP-BC  COVID-19 PATIENT SCREENING TOOL Asked and negative response unless otherwise noted:   Have you had symptoms of covid, tested positive or been in contact with someone with symptoms/positive test in the past 5-10 days?

## 2021-03-04 ENCOUNTER — Ambulatory Visit: Payer: Medicare PPO | Admitting: Obstetrics & Gynecology

## 2021-03-04 DIAGNOSIS — Z0289 Encounter for other administrative examinations: Secondary | ICD-10-CM

## 2021-03-05 ENCOUNTER — Other Ambulatory Visit: Payer: Self-pay | Admitting: Obstetrics & Gynecology

## 2021-03-05 NOTE — Telephone Encounter (Signed)
Last AEX was 03/03/20. Scheduled for AEX 03/27/21.

## 2021-03-27 ENCOUNTER — Ambulatory Visit (INDEPENDENT_AMBULATORY_CARE_PROVIDER_SITE_OTHER): Payer: Medicare PPO | Admitting: Obstetrics & Gynecology

## 2021-03-27 ENCOUNTER — Encounter: Payer: Self-pay | Admitting: Obstetrics & Gynecology

## 2021-03-27 ENCOUNTER — Other Ambulatory Visit: Payer: Self-pay

## 2021-03-27 ENCOUNTER — Other Ambulatory Visit (HOSPITAL_COMMUNITY)
Admission: RE | Admit: 2021-03-27 | Discharge: 2021-03-27 | Disposition: A | Discharge status: Home / Self Care | Payer: Medicare PPO | Source: Ambulatory Visit | Attending: Obstetrics & Gynecology | Admitting: Obstetrics & Gynecology

## 2021-03-27 VITALS — BP 110/78 | HR 84 | Resp 16

## 2021-03-27 DIAGNOSIS — N870 Mild cervical dysplasia: Secondary | ICD-10-CM | POA: Diagnosis not present

## 2021-03-27 DIAGNOSIS — Z3009 Encounter for other general counseling and advice on contraception: Secondary | ICD-10-CM

## 2021-03-27 DIAGNOSIS — Z01419 Encounter for gynecological examination (general) (routine) without abnormal findings: Secondary | ICD-10-CM | POA: Insufficient documentation

## 2021-03-27 DIAGNOSIS — R87810 Cervical high risk human papillomavirus (HPV) DNA test positive: Secondary | ICD-10-CM

## 2021-03-27 NOTE — Progress Notes (Addendum)
Barbara Gallegos 1970/10/06 [id number]   History:    50 y.o. G2P2L2.  Married x 3 year.   RP:  Established patient presenting for annual gyn exam    HPI: Well on BCP with Hailey 24 Fe 1/20. No BTB. No pelvic pain.  No abnormal discharge.  No pain with IC.  Urine/BMs normal.  Breasts normal.  Health labs with Fam MD. Huntington's disease with frequent falls.     Past medical history,surgical history, family history and social history were all reviewed and documented in the EPIC chart.  Gynecologic History Patient's last menstrual period was 03/22/2021 (exact date).  Obstetric History OB History  Gravida Para Term Preterm AB Living  2 2       2   SAB IAB Ectopic Multiple Live Births               # Outcome Date GA Lbr Len/2nd Weight Sex Delivery Anes PTL Lv  2 Para           1 Para              ROS: A ROS was performed and pertinent positives and negatives are included in the history.  GENERAL: No fevers or chills. HEENT: No change in vision, no earache, sore throat or sinus congestion. NECK: No pain or stiffness. CARDIOVASCULAR: No chest pain or pressure. No palpitations. PULMONARY: No shortness of breath, cough or wheeze. GASTROINTESTINAL: No abdominal pain, nausea, vomiting or diarrhea, melena or bright red blood per rectum. GENITOURINARY: No urinary frequency, urgency, hesitancy or dysuria. MUSCULOSKELETAL: No joint or muscle pain, no back pain, no recent trauma. DERMATOLOGIC: No rash, no itching, no lesions. ENDOCRINE: No polyuria, polydipsia, no heat or cold intolerance. No recent change in weight. HEMATOLOGICAL: No anemia or easy bruising or bleeding. NEUROLOGIC: No headache, seizures, numbness, tingling or weakness. PSYCHIATRIC: No depression, no loss of interest in normal activity or change in sleep pattern.     Exam:   BP 110/78   Pulse 84   Resp 16   LMP 03/22/2021 (Exact Date)   General appearance : Well developed well nourished female. No acute distress HEENT:  Eyes: no retinal hemorrhage or exudates,  Neck supple, trachea midline, no carotid bruits, no thyroidmegaly Lungs: Clear to auscultation, no rhonchi or wheezes, or rib retractions  Heart: Regular rate and rhythm, no murmurs or gallops Breast:Examined in sitting and supine position were symmetrical in appearance, no palpable masses or tenderness,  no skin retraction, no nipple inversion, no nipple discharge, no skin discoloration, no axillary or supraclavicular lymphadenopathy Abdomen: no palpable masses or tenderness, no rebound or guarding Extremities: no edema or skin discoloration or tenderness  Pelvic: Vulva: Normal             Vagina: No gross lesions or discharge  Cervix: No gross lesions or discharge.  Pap reflex done.  Uterus  AV, normal size, shape and consistency, non-tender and mobile  Adnexa  Without masses or tenderness  Anus: Normal   Assessment/Plan:  50 y.o. female for annual exam   1. Encounter for routine gynecological examination with Papanicolaou smear of cervix Normal gynecologic exam.  Pap reflex done.  Breast exam normal.  We will schedule a screening mammogram at the breast center now.  Health labs with family physician. - Cytology - PAP( [facility])  2. Dysplasia of cervix, low grade (CIN 1) Pap reflex done.  3. Cervical high risk HPV (human papillomavirus) test positive Pap reflex done.  4. Encounter for other  general counseling or advice on contraception Well on Hailey 24 1/20 Fe.  Will send prescription (Sending prescription today 06/23/2021).  Other orders - budesonide (PULMICORT) 0.5 MG/2ML nebulizer solution; USE 1 VIAL IN NEBULIZER TWICE DAILY - traZODone (DESYREL) 50 MG tablet; Take 25 mg by mouth at bedtime as needed.   Genia Del MD, 3:21 PM 03/27/2021

## 2021-04-01 LAB — CYTOLOGY - PAP: Diagnosis: NEGATIVE

## 2021-04-03 ENCOUNTER — Encounter: Payer: Self-pay | Admitting: *Deleted

## 2021-05-12 ENCOUNTER — Other Ambulatory Visit: Payer: Self-pay | Admitting: *Deleted

## 2021-06-17 ENCOUNTER — Other Ambulatory Visit: Payer: Self-pay | Admitting: Obstetrics & Gynecology

## 2021-06-19 ENCOUNTER — Telehealth: Payer: Self-pay | Admitting: *Deleted

## 2021-06-19 ENCOUNTER — Emergency Department (HOSPITAL_BASED_OUTPATIENT_CLINIC_OR_DEPARTMENT_OTHER)
Admission: EM | Admit: 2021-06-19 | Discharge: 2021-06-19 | Disposition: A | Discharge status: Home / Self Care | Payer: Medicare PPO | Attending: Emergency Medicine | Admitting: Emergency Medicine

## 2021-06-19 ENCOUNTER — Other Ambulatory Visit: Payer: Self-pay

## 2021-06-19 DIAGNOSIS — Z20822 Contact with and (suspected) exposure to covid-19: Secondary | ICD-10-CM | POA: Insufficient documentation

## 2021-06-19 DIAGNOSIS — J3489 Other specified disorders of nose and nasal sinuses: Secondary | ICD-10-CM | POA: Diagnosis not present

## 2021-06-19 DIAGNOSIS — J101 Influenza due to other identified influenza virus with other respiratory manifestations: Secondary | ICD-10-CM | POA: Insufficient documentation

## 2021-06-19 DIAGNOSIS — J111 Influenza due to unidentified influenza virus with other respiratory manifestations: Secondary | ICD-10-CM

## 2021-06-19 DIAGNOSIS — R509 Fever, unspecified: Secondary | ICD-10-CM | POA: Diagnosis present

## 2021-06-19 LAB — RESP PANEL BY RT-PCR (FLU A&B, COVID) ARPGX2
Influenza A by PCR: POSITIVE — AB
Influenza B by PCR: NEGATIVE
SARS Coronavirus 2 by RT PCR: NEGATIVE

## 2021-06-19 MED ORDER — BENZONATATE 100 MG PO CAPS: 100.0000 mg | ORAL_CAPSULE | Freq: Three times a day (TID) | ORAL | 0 refills | Status: AC | PRN

## 2021-06-19 MED ORDER — BENZONATATE 100 MG PO CAPS
100.0000 mg | ORAL_CAPSULE | Freq: Once | ORAL | Status: AC
  Administered 2021-06-19: 100 mg via ORAL
  Filled 2021-06-19: qty 1

## 2021-06-19 NOTE — ED Triage Notes (Signed)
Pt BIB Husband d/t concerns for severe cough, runny nose, body aches, and fevers x4 days.

## 2021-06-19 NOTE — ED Provider Notes (Signed)
[facility] EMERGENCY DEPT Provider Note   CSN: [id number] Arrival date & time: 06/19/21  0320     History Chief Complaint  Patient presents with   Fever    Barbara Gallegos is a 50 y.o. female.   Fever Max temp prior to arrival:  103 Severity:  Mild Onset quality:  Gradual Timing:  Constant Progression:  Worsening Chronicity:  New Relieved by:  None tried Worsened by:  Nothing Ineffective treatments:  None tried Associated symptoms: cough, myalgias, rash and rhinorrhea   Associated symptoms: no chest pain       Past Medical History:  Diagnosis Date   Huntington disease (HCC)    Prediabetes     There are no problems to display for this patient.   Past Surgical History:  Procedure Laterality Date   arm surgery     HAND SURGERY     HAND SURGERY       OB History     Gravida  2   Para  2   Term      Preterm      AB      Living  2      SAB      IAB      Ectopic      Multiple      Live Births              Family History  Problem Relation Age of Onset   Huntington's disease Mother    Heart disease Father    Diabetes Maternal Grandmother     Social History   Tobacco Use   Smoking status: Never   Smokeless tobacco: Never  Vaping Use   Vaping Use: Never used  Substance Use Topics   Alcohol use: Never   Drug use: Never    Home Medications Prior to Admission medications   Medication Sig Start Date End Date Taking? Authorizing Provider  benzonatate (TESSALON) 100 MG capsule Take 1 capsule (100 mg total) by mouth 3 (three) times daily as needed for cough. 06/19/21  Yes Jullie Arps, Barbara Cower, MD  budesonide (PULMICORT) 0.5 MG/2ML nebulizer solution USE 1 VIAL IN NEBULIZER TWICE DAILY 10/28/20   [provider]  Multiple Vitamin (MULTI-VITAMIN) tablet Take 1 tablet by mouth daily.    [provider]  Norethindrone Acetate-Ethinyl Estrad-FE (HAILEY 24 FE) 1-20 MG-MCG(24) tablet Take 1 tablet by mouth once  daily 03/05/21   Genia Del, MD  sertraline (ZOLOFT) 100 MG tablet Take 100 mg by mouth daily. 02/25/20   [provider]  traZODone (DESYREL) 50 MG tablet Take 25 mg by mouth at bedtime as needed. 11/25/20   [provider]  omeprazole (PRILOSEC) 40 MG capsule Take 1 capsule (40 mg total) by mouth daily. 07/27/19 05/27/20  Rachael Fee, MD    Allergies    Codeine  Review of Systems   Review of Systems  Constitutional:  Positive for fever.  HENT:  Positive for rhinorrhea.   Respiratory:  Positive for cough.   Cardiovascular:  Negative for chest pain.  Musculoskeletal:  Positive for myalgias.  Skin:  Positive for rash.  All other systems reviewed and are negative.  Physical Exam Updated Vital Signs BP 112/61   Pulse 86   Temp 98.9 F (37.2 C)   Resp 20   Ht 5\' 5"  (1.651 m)   Wt 72.6 kg   SpO2 97%   BMI 26.63 kg/m   Physical Exam Vitals and nursing note reviewed.  Constitutional:  Appearance: She is well-developed.  HENT:     Head: Normocephalic and atraumatic.     Nose: Rhinorrhea present. No congestion.     Mouth/Throat:     Mouth: Mucous membranes are moist.     Pharynx: Oropharynx is clear.  Eyes:     Pupils: Pupils are equal, round, and reactive to light.  Cardiovascular:     Rate and Rhythm: Normal rate and regular rhythm.  Pulmonary:     Effort: No respiratory distress.     Breath sounds: No stridor.  Abdominal:     General: There is no distension.  Musculoskeletal:        General: No swelling or tenderness. Normal range of motion.     Cervical back: Normal range of motion.  Skin:    General: Skin is warm and dry.  Neurological:     General: No focal deficit present.     Mental Status: She is alert.    ED Results / Procedures / Treatments   Labs (all labs ordered are listed, but only abnormal results are displayed) Labs Reviewed  RESP PANEL BY RT-PCR (FLU A&B, COVID) ARPGX2 - Abnormal; Notable for the following  components:      Result Value   Influenza A by PCR POSITIVE (*)    All other components within normal limits    EKG None  Radiology No results found.  Procedures Procedures   Medications Ordered in ED Medications  benzonatate (TESSALON) capsule 100 mg (100 mg Oral Given 06/19/21 5643)    ED Course  I have reviewed the triage vital signs and the nursing notes.  Pertinent labs & imaging results that were available during my care of the patient were reviewed by me and considered in my medical decision making (see chart for details).    MDM Rules/Calculators/A&P                         Patient with uncomplicated influenza.  Husband with same.  I discussed supportive care with both of them.  Patient's not really a candidate for multiple of the cough medicines secondary to her Huntington's.  Suggested trying Tessalon if that did not work possible home remedies.  Husband feels confident and competent to take care of the patient at home.  Discharged to his care.  Final Clinical Impression(s) / ED Diagnoses Final diagnoses:  Influenza    Rx / DC Orders ED Discharge Orders          Ordered    benzonatate (TESSALON) 100 MG capsule  3 times daily PRN        06/19/21 0504             Zahra Peffley, Barbara Cower, MD 06/19/21 2349

## 2021-06-19 NOTE — ED Notes (Signed)
Pt's husband verbalizes understanding of discharge instructions. Opportunity for questioning and answers were provided. Pt discharged from ED to home.

## 2021-06-19 NOTE — Telephone Encounter (Signed)
Patient husband called stating birth control pills have been denied. Per note on 03/27/21 "Declines contraception."  Left message for patient husband Trey Paula to call. ( He has DPR access)

## 2021-06-22 NOTE — Telephone Encounter (Signed)
Dr.Lavoie per note "Per note on 03/27/21 "Declines contraception."  Patient husband Trey Paula (has DPR access) reports patient never stopped pills and would like to continue pills. Okay to send Rx?

## 2021-06-23 ENCOUNTER — Other Ambulatory Visit: Payer: Self-pay | Admitting: Obstetrics & Gynecology

## 2021-06-26 NOTE — Telephone Encounter (Signed)
Dr.Lavoie sent Rx in on 06/23/21

## 2021-11-10 ENCOUNTER — Telehealth: Payer: Self-pay

## 2021-11-10 NOTE — Telephone Encounter (Signed)
PC check in call by volunteer. Patient has seen lung specialist increased inhaler use ?

## 2021-12-21 ENCOUNTER — Telehealth: Payer: Self-pay

## 2021-12-21 NOTE — Telephone Encounter (Signed)
Volunteer check in call for palliative care. Husband is starting to notice some changes, patient currently goes to day center

## 2022-04-29 ENCOUNTER — Emergency Department (HOSPITAL_COMMUNITY): Payer: Medicare PPO

## 2022-04-29 ENCOUNTER — Encounter (HOSPITAL_COMMUNITY): Payer: Self-pay | Admitting: Emergency Medicine

## 2022-04-29 ENCOUNTER — Emergency Department (HOSPITAL_COMMUNITY)
Admission: EM | Admit: 2022-04-29 | Discharge: 2022-04-30 | Disposition: A | Discharge status: Home / Self Care | Payer: Medicare PPO | Attending: Emergency Medicine | Admitting: Emergency Medicine

## 2022-04-29 ENCOUNTER — Other Ambulatory Visit: Payer: Self-pay

## 2022-04-29 DIAGNOSIS — N3000 Acute cystitis without hematuria: Secondary | ICD-10-CM | POA: Diagnosis not present

## 2022-04-29 DIAGNOSIS — R471 Dysarthria and anarthria: Secondary | ICD-10-CM | POA: Diagnosis not present

## 2022-04-29 DIAGNOSIS — R059 Cough, unspecified: Secondary | ICD-10-CM | POA: Insufficient documentation

## 2022-04-29 DIAGNOSIS — R531 Weakness: Secondary | ICD-10-CM | POA: Insufficient documentation

## 2022-04-29 DIAGNOSIS — N9489 Other specified conditions associated with female genital organs and menstrual cycle: Secondary | ICD-10-CM | POA: Insufficient documentation

## 2022-04-29 DIAGNOSIS — G10 Huntington's disease: Secondary | ICD-10-CM

## 2022-04-29 DIAGNOSIS — Z20822 Contact with and (suspected) exposure to covid-19: Secondary | ICD-10-CM | POA: Insufficient documentation

## 2022-04-29 DIAGNOSIS — R300 Dysuria: Secondary | ICD-10-CM | POA: Diagnosis present

## 2022-04-29 LAB — URINALYSIS, ROUTINE W REFLEX MICROSCOPIC
Bilirubin Urine: NEGATIVE
Glucose, UA: NEGATIVE mg/dL
Ketones, ur: NEGATIVE mg/dL
Nitrite: NEGATIVE
Protein, ur: NEGATIVE mg/dL
Specific Gravity, Urine: 1.011 (ref 1.005–1.030)
pH: 7 (ref 5.0–8.0)

## 2022-04-29 LAB — COMPREHENSIVE METABOLIC PANEL
ALT: 11 U/L (ref 0–44)
AST: 13 U/L — ABNORMAL LOW (ref 15–41)
Albumin: 3.9 g/dL (ref 3.5–5.0)
Alkaline Phosphatase: 47 U/L (ref 38–126)
Anion gap: 5 (ref 5–15)
BUN: 17 mg/dL (ref 6–20)
CO2: 25 mmol/L (ref 22–32)
Calcium: 9.3 mg/dL (ref 8.9–10.3)
Chloride: 108 mmol/L (ref 98–111)
Creatinine, Ser: 0.71 mg/dL (ref 0.44–1.00)
GFR, Estimated: >60 mL/min (ref >60)
Glucose, Bld: 122 mg/dL — ABNORMAL HIGH (ref 70–99)
Potassium: 3.8 mmol/L (ref 3.5–5.1)
Sodium: 138 mmol/L (ref 135–145)
Total Bilirubin: 0.5 mg/dL (ref 0.3–1.2)
Total Protein: 7.2 g/dL (ref 6.5–8.1)

## 2022-04-29 LAB — CBC WITH DIFFERENTIAL/PLATELET
Abs Immature Granulocytes: 0.02 10*3/uL (ref 0.00–0.07)
Basophils Absolute: 0.1 10*3/uL (ref 0.0–0.1)
Basophils Relative: 1 %
Eosinophils Absolute: 0.4 10*3/uL (ref 0.0–0.5)
Eosinophils Relative: 4 %
HCT: 36.6 % (ref 36.0–46.0)
Hemoglobin: 12.4 g/dL (ref 12.0–15.0)
Immature Granulocytes: 0 %
Lymphocytes Relative: 29 %
Lymphs Abs: 2.5 10*3/uL (ref 0.7–4.0)
MCH: 28.7 pg (ref 26.0–34.0)
MCHC: 33.9 g/dL (ref 30.0–36.0)
MCV: 84.7 fL (ref 80.0–100.0)
Monocytes Absolute: 0.6 10*3/uL (ref 0.1–1.0)
Monocytes Relative: 7 %
Neutro Abs: 5 10*3/uL (ref 1.7–7.7)
Neutrophils Relative %: 59 %
Platelets: 324 10*3/uL (ref 150–400)
RBC: 4.32 MIL/uL (ref 3.87–5.11)
RDW: 12.3 % (ref 11.5–15.5)
WBC: 8.5 10*3/uL (ref 4.0–10.5)
nRBC: 0 % (ref 0.0–0.2)

## 2022-04-29 LAB — APTT: aPTT: 23 seconds — ABNORMAL LOW (ref 24–36)

## 2022-04-29 LAB — RESP PANEL BY RT-PCR (FLU A&B, COVID) ARPGX2
Influenza A by PCR: NEGATIVE
Influenza B by PCR: NEGATIVE
SARS Coronavirus 2 by RT PCR: NEGATIVE

## 2022-04-29 LAB — I-STAT BETA HCG BLOOD, ED (MC, WL, AP ONLY): I-stat hCG, quantitative: <5 m[IU]/mL (ref <5)

## 2022-04-29 LAB — PROTIME-INR
INR: 1 (ref 0.8–1.2)
Prothrombin Time: 13.3 seconds (ref 11.4–15.2)

## 2022-04-29 LAB — LACTIC ACID, PLASMA: Lactic Acid, Venous: 0.9 mmol/L (ref 0.5–1.9)

## 2022-04-29 MED ORDER — LACTATED RINGERS IV BOLUS (SEPSIS)
1000.0000 mL | Freq: Once | INTRAVENOUS | Status: AC
  Administered 2022-04-29: 1000 mL via INTRAVENOUS

## 2022-04-29 MED ORDER — LACTATED RINGERS IV SOLN: INTRAVENOUS | Status: DC

## 2022-04-29 NOTE — ED Provider Notes (Signed)
[facility]-EMERGENCY DEPT Provider Note   CSN: [id number] Arrival date & time: 04/29/22  2122     History  Chief Complaint  Patient presents with   Weakness    Barbara Gallegos is a 51 y.o. female.   Weakness    Patient has a history of Huntington's chorea.  She presents to the ED for evaluation of increasing weakness and dysuria.  Patient denies any vomiting or diarrhea.  She has had a slight cough but nothing severe.  Patient does complain of discomfort with urination.  She at baseline has to use a wheelchair.  She lives at home with her husband.  Outpatient records reviewed.  Patient is at home during the day when the husband works.  Home health to assist the patient.  She did have a fall October tens when she was not using her walker or wheelchair.  No injuries were noted at that time  Home Medications Prior to Admission medications   Medication Sig Start Date End Date Taking? Authorizing Provider  cefUROXime (CEFTIN) 500 MG tablet Take 1 tablet (500 mg total) by mouth 2 (two) times daily with a meal for 5 days. 04/30/22 05/05/22 Yes Linwood Dibbles, MD  benzonatate (TESSALON) 100 MG capsule Take 1 capsule (100 mg total) by mouth 3 (three) times daily as needed for cough. 06/19/21   Mesner, Barbara Cower, MD  budesonide (PULMICORT) 0.5 MG/2ML nebulizer solution USE 1 VIAL IN NEBULIZER TWICE DAILY 10/28/20   [provider]  Multiple Vitamin (MULTI-VITAMIN) tablet Take 1 tablet by mouth daily.    [provider]  Norethindrone Acetate-Ethinyl Estrad-FE (HAILEY 24 FE) 1-20 MG-MCG(24) tablet Take 1 tablet by mouth once daily 06/23/21   Genia Del, MD  sertraline (ZOLOFT) 100 MG tablet Take 100 mg by mouth daily. 02/25/20   [provider]  traZODone (DESYREL) 50 MG tablet Take 25 mg by mouth at bedtime as needed. 11/25/20   [provider]  omeprazole (PRILOSEC) 40 MG capsule Take 1 capsule (40 mg total) by mouth daily. 07/27/19 05/27/20   Rachael Fee, MD      Allergies    Codeine    Review of Systems   Review of Systems  Neurological:  Positive for weakness.    Physical Exam Updated Vital Signs BP 129/68   Pulse (!) 103   Temp 97.9 F (36.6 C)   Resp (!) 24   Ht 1.651 m (5\' 5" )   Wt 73 kg   SpO2 96%   BMI 26.78 kg/m  Physical Exam Vitals and nursing note reviewed.  Constitutional:      Appearance: She is well-developed. She is not diaphoretic.  HENT:     Head: Normocephalic and atraumatic.     Right Ear: External ear normal.     Left Ear: External ear normal.     Nose: No rhinorrhea.     Mouth/Throat:     Pharynx: No posterior oropharyngeal erythema.  Eyes:     General: No scleral icterus.       Right eye: No discharge.        Left eye: No discharge.     Conjunctiva/sclera: Conjunctivae normal.  Neck:     Trachea: No tracheal deviation.  Cardiovascular:     Rate and Rhythm: Normal rate and regular rhythm.  Pulmonary:     Effort: Pulmonary effort is normal. No respiratory distress.     Breath sounds: No stridor.  Abdominal:     General: There is no distension.  Palpations: There is no mass.     Tenderness: There is no abdominal tenderness.  Musculoskeletal:        General: No swelling or deformity.     Cervical back: Neck supple.  Skin:    General: Skin is warm and dry.     Findings: No rash.  Neurological:     Mental Status: She is alert.     Cranial Nerves: Dysarthria present.     Motor: Abnormal muscle tone present.     Coordination: Coordination abnormal.     ED Results / Procedures / Treatments   Labs (all labs ordered are listed, but only abnormal results are displayed) Labs Reviewed  COMPREHENSIVE METABOLIC PANEL - Abnormal; Notable for the following components:      Result Value   Glucose, Bld 122 (*)    AST 13 (*)    All other components within normal limits  APTT - Abnormal; Notable for the following components:   aPTT 23 (*)    All other components within  normal limits  URINALYSIS, ROUTINE W REFLEX MICROSCOPIC - Abnormal; Notable for the following components:   Hgb urine dipstick SMALL (*)    Leukocytes,Ua LARGE (*)    Bacteria, UA RARE (*)    All other components within normal limits  RESP PANEL BY RT-PCR (FLU A&B, COVID) ARPGX2  CULTURE, BLOOD (ROUTINE X 2)  CULTURE, BLOOD (ROUTINE X 2)  URINE CULTURE  LACTIC ACID, PLASMA  CBC WITH DIFFERENTIAL/PLATELET  PROTIME-INR  CBC  I-STAT BETA HCG BLOOD, ED ([facility], [facility], [facility] ONLY)    EKG EKG Interpretation  Date/Time:  Thursday April 29 2022 21:31:59 EDT Ventricular Rate:  89 PR Interval:  170 QRS Duration: 109 QT Interval:  366 QTC Calculation: 446 R Axis:   35 Text Interpretation: Sinus rhythm Biatrial enlargement RSR' in V1 or V2, right VCD or RVH Confirmed by Linwood Dibbles [phone]) on 04/29/2022 10:36:25 PM  Radiology DG Chest Port 1 View  Result Date: 04/29/2022 CLINICAL DATA:  Weakness, dysuria, Huntington's disease EXAM: PORTABLE CHEST 1 VIEW COMPARISON:  05/26/2020 FINDINGS: Lungs are clear.  No pleural effusion or pneumothorax. The heart is normal in size. IMPRESSION: No evidence of acute cardiopulmonary disease. Electronically Signed   By: Charline Bills M.D.   On: 04/29/2022 22:08    Procedures Procedures    Medications Ordered in ED Medications  lactated ringers infusion ( Intravenous New Bag/Given 04/29/22 2236)  cefTRIAXone (ROCEPHIN) 1 g in sodium chloride 0.9 % 100 mL IVPB (1 g Intravenous New Bag/Given 04/30/22 0008)  lactated ringers bolus 1,000 mL (0 mLs Intravenous Stopped 04/29/22 2331)    ED Course/ Medical Decision Making/ A&P Clinical Course as of 04/30/22 0019  Thu Apr 29, 2022  2235 CBC with Differential CBC normal [JK]  2235 I-Stat beta hCG blood, ED Pregnancy test negative [JK]  2235 Chest x-ray without signs of pneumonia [JK]    Clinical Course User Index [JK] Linwood Dibbles, MD                           Medical Decision Making DDX includes  sepsis, uti, dehydration, anemia  Problems Addressed: Acute cystitis without hematuria: acute illness or injury Huntington chorea (HCC): chronic illness or injury  Amount and/or Complexity of Data Reviewed Labs: ordered. Decision-making details documented in ED Course. Radiology: ordered and independent interpretation performed. ECG/medicine tests: ordered.  Risk Prescription drug management.  Patient presented to the ED for evaluation of weakness dysuria.  She has chronic disability associated with her Huntington's chorea.  Patient's ED work-up overall reassuring.  No signs of anemia or dehydration.  No findings suggest sepsis.  Patient was treated with IV fluids.  She noticed some improvement.  She was able to eat and drink here without difficulty.  Urinalysis is consistent with infection.  Plan on discharge home with a course of antibiotics.  Patient and her husband are comfortable with this plan.         Final Clinical Impression(s) / ED Diagnoses Final diagnoses:  Acute cystitis without hematuria  Huntington chorea ([place])    Rx / DC Orders ED Discharge Orders          Ordered    cefUROXime (CEFTIN) 500 MG tablet  2 times daily with meals        04/30/22 0004              Dorie Rank, MD 04/30/22 0020

## 2022-04-29 NOTE — Sepsis Progress Note (Signed)
Following per sepsis protocol   

## 2022-04-29 NOTE — ED Triage Notes (Addendum)
Pt BIB EMS from home with c/o increasing weakness and dysuria. Pt has Huntington's disease

## 2022-04-30 MED ORDER — SODIUM CHLORIDE 0.9 % IV SOLN
1.0000 g | Freq: Once | INTRAVENOUS | Status: AC
  Administered 2022-04-30: 1 g via INTRAVENOUS
  Filled 2022-04-30: qty 10

## 2022-04-30 MED ORDER — CEFUROXIME AXETIL 500 MG PO TABS: 500.0000 mg | ORAL_TABLET | Freq: Two times a day (BID) | ORAL | 0 refills | Status: AC

## 2022-04-30 NOTE — Discharge Instructions (Signed)
Take the antibiotics as prescribed.  Return for fever, vomiting, worsening symptoms

## 2022-05-01 LAB — URINE CULTURE: Culture: 20000 — AB

## 2022-05-02 ENCOUNTER — Telehealth (HOSPITAL_BASED_OUTPATIENT_CLINIC_OR_DEPARTMENT_OTHER): Payer: Self-pay | Admitting: *Deleted

## 2022-05-02 NOTE — Telephone Encounter (Signed)
Post ED Visit - Positive Culture Follow-up  Culture report reviewed by antimicrobial stewardship pharmacist: [place] Team []  Elenor Quinones, Pharm.D. []  Heide Guile, Pharm.D., BCPS AQ-ID []  Parks Neptune, Pharm.D., BCPS []  Alycia Rossetti, Pharm.D., BCPS []  [place], Pharm.D., BCPS, AAHIVP []  Legrand Como, Pharm.D., BCPS, AAHIVP []  Salome Arnt, PharmD, BCPS []  Johnnette Gourd, PharmD, BCPS []  Hughes Better, PharmD, BCPS []  Leeroy Cha, PharmD []  Laqueta Linden, PharmD, BCPS []  Albertina Parr, PharmD  [place] Team []  Leodis Sias, PharmD []  Lindell Spar, PharmD []  Royetta Asal, PharmD []  Graylin Shiver, Rph []  Rema Fendt) Glennon Mac, PharmD []  Arlyn Dunning, PharmD []  Netta Cedars, PharmD []  Dia Sitter, PharmD []  Leone Haven, PharmD []  Gretta Arab, PharmD []  Theodis Shove, PharmD []  Peggyann Juba, PharmD [x]  Eilene Ghazi, PharmD   Positive urine culture Treated with Cefuroxime Axetil,  and no further patient follow-up is required at this time.  Rosie Fate 05/02/2022, 9:00 AM

## 2022-05-04 LAB — CULTURE, BLOOD (ROUTINE X 2)
Culture: NO GROWTH
Culture: NO GROWTH
Special Requests: ADEQUATE

## 2022-06-21 ENCOUNTER — Other Ambulatory Visit (HOSPITAL_COMMUNITY): Payer: Self-pay

## 2022-06-21 DIAGNOSIS — R1310 Dysphagia, unspecified: Secondary | ICD-10-CM

## 2022-06-30 ENCOUNTER — Other Ambulatory Visit: Payer: Self-pay | Admitting: Obstetrics & Gynecology

## 2022-07-02 ENCOUNTER — Encounter (HOSPITAL_COMMUNITY): Payer: Self-pay

## 2022-07-02 ENCOUNTER — Ambulatory Visit (HOSPITAL_COMMUNITY): Admission: RE | Admit: 2022-07-02 | Payer: Medicare PPO | Source: Ambulatory Visit

## 2022-07-08 ENCOUNTER — Telehealth (HOSPITAL_COMMUNITY): Payer: Self-pay | Admitting: *Deleted

## 2022-07-08 NOTE — Telephone Encounter (Signed)
Attempted to contact patient to rescheduled missed OP MBS appointment. Left VM. RKEEL

## 2022-08-06 ENCOUNTER — Ambulatory Visit (HOSPITAL_COMMUNITY): Admission: RE | Admit: 2022-08-06 | Payer: Medicare PPO | Source: Ambulatory Visit

## 2022-08-06 ENCOUNTER — Encounter (HOSPITAL_COMMUNITY): Payer: Self-pay

## 2022-08-20 ENCOUNTER — Encounter (HOSPITAL_COMMUNITY): Payer: Medicare PPO

## 2022-08-24 ENCOUNTER — Other Ambulatory Visit: Payer: Self-pay

## 2022-08-24 ENCOUNTER — Emergency Department (HOSPITAL_COMMUNITY)
Admission: EM | Admit: 2022-08-24 | Discharge: 2022-08-25 | Disposition: A | Discharge status: Home / Self Care | Payer: Medicare PPO | Attending: Emergency Medicine | Admitting: Emergency Medicine

## 2022-08-24 ENCOUNTER — Encounter (HOSPITAL_COMMUNITY): Payer: Self-pay

## 2022-08-24 ENCOUNTER — Emergency Department (HOSPITAL_COMMUNITY): Payer: Medicare PPO

## 2022-08-24 DIAGNOSIS — R059 Cough, unspecified: Secondary | ICD-10-CM | POA: Diagnosis not present

## 2022-08-24 DIAGNOSIS — J101 Influenza due to other identified influenza virus with other respiratory manifestations: Secondary | ICD-10-CM | POA: Insufficient documentation

## 2022-08-24 DIAGNOSIS — Z1152 Encounter for screening for COVID-19: Secondary | ICD-10-CM | POA: Diagnosis not present

## 2022-08-24 DIAGNOSIS — R531 Weakness: Secondary | ICD-10-CM | POA: Diagnosis not present

## 2022-08-24 DIAGNOSIS — R0602 Shortness of breath: Secondary | ICD-10-CM | POA: Diagnosis not present

## 2022-08-24 LAB — COMPREHENSIVE METABOLIC PANEL
ALT: 25 U/L (ref 0–44)
AST: 23 U/L (ref 15–41)
Albumin: 4.1 g/dL (ref 3.5–5.0)
Alkaline Phosphatase: 69 U/L (ref 38–126)
Anion gap: 10 (ref 5–15)
BUN: 21 mg/dL — ABNORMAL HIGH (ref 6–20)
CO2: 26 mmol/L (ref 22–32)
Calcium: 9.4 mg/dL (ref 8.9–10.3)
Chloride: 103 mmol/L (ref 98–111)
Creatinine, Ser: 0.8 mg/dL (ref 0.44–1.00)
GFR, Estimated: >60 mL/min (ref >60)
Glucose, Bld: 146 mg/dL — ABNORMAL HIGH (ref 70–99)
Potassium: 3.7 mmol/L (ref 3.5–5.1)
Sodium: 139 mmol/L (ref 135–145)
Total Bilirubin: 0.5 mg/dL (ref 0.3–1.2)
Total Protein: 8.5 g/dL — ABNORMAL HIGH (ref 6.5–8.1)

## 2022-08-24 LAB — CBC
HCT: 40.9 % (ref 36.0–46.0)
Hemoglobin: 13.5 g/dL (ref 12.0–15.0)
MCH: 28.1 pg (ref 26.0–34.0)
MCHC: 33 g/dL (ref 30.0–36.0)
MCV: 85.2 fL (ref 80.0–100.0)
Platelets: 326 10*3/uL (ref 150–400)
RBC: 4.8 MIL/uL (ref 3.87–5.11)
RDW: 12.2 % (ref 11.5–15.5)
WBC: 9.5 10*3/uL (ref 4.0–10.5)
nRBC: 0 % (ref 0.0–0.2)

## 2022-08-24 NOTE — ED Provider Notes (Signed)
[place] EMERGENCY DEPARTMENT AT [facility] Provider Note   CSN: [id number] Arrival date & time: 08/24/22  2253     History {Add pertinent medical, surgical, social history, OB history to HPI:1} Chief Complaint  Patient presents with   Shortness of Breath    [name] is a 52 y.o. female.  The history is provided by the patient and medical records.  Shortness of Breath Barbara Gallegos is a 52 y.o. female who presents to the Emergency Department complaining of *** Ddimer Sob for four weeks pain on breathing central and right sided.    Cough yellow/green.  Initially had fever.    No strength to walk/gets sob Has BLE edema at night.    Huntingtons Tob Ocp  No personal hx/o dvt/pe Father with MI       Home Medications Prior to Admission medications   Medication Sig Start Date End Date Taking? Authorizing Provider  benzonatate (TESSALON) 100 MG capsule Take 1 capsule (100 mg total) by mouth 3 (three) times daily as needed for cough. 06/19/21   Mesner, Corene Cornea, MD  budesonide (PULMICORT) 0.5 MG/2ML nebulizer solution USE 1 VIAL IN NEBULIZER TWICE DAILY 10/28/20   [provider]  Multiple Vitamin (MULTI-VITAMIN) tablet Take 1 tablet by mouth daily.    [provider]  Norethindrone Acetate-Ethinyl Estrad-FE (HAILEY 24 FE) 1-20 MG-MCG(24) tablet Take 1 tablet by mouth once daily 06/23/21   Princess Bruins, MD  sertraline (ZOLOFT) 100 MG tablet Take 100 mg by mouth daily. 02/25/20   [provider]  traZODone (DESYREL) 50 MG tablet Take 25 mg by mouth at bedtime as needed. 11/25/20   [provider]  omeprazole (PRILOSEC) 40 MG capsule Take 1 capsule (40 mg total) by mouth daily. 07/27/19 05/27/20  Milus Banister, MD      Allergies    Codeine and Other    Review of Systems   Review of Systems  Respiratory:  Positive for shortness of breath.   All other systems reviewed and are negative.   Physical Exam Updated  Vital Signs BP (!) 118/43   Pulse 95   Temp 98.2 F (36.8 C) (Oral)   Resp 17   Ht 5\' 5"  (1.651 m)   Wt 72.6 kg   SpO2 100%   BMI 26.63 kg/m  Physical Exam Vitals and nursing note reviewed.  Constitutional:      Appearance: She is well-developed.  HENT:     Head: Normocephalic and atraumatic.  Cardiovascular:     Rate and Rhythm: Normal rate and regular rhythm.     Heart sounds: No murmur heard. Pulmonary:     Effort: Pulmonary effort is normal. No respiratory distress.     Breath sounds: Normal breath sounds.  Abdominal:     Palpations: Abdomen is soft.     Tenderness: There is no abdominal tenderness. There is no guarding or rebound.  Musculoskeletal:        General: No tenderness.  Skin:    General: Skin is warm and dry.  Neurological:     Mental Status: She is alert and oriented to person, place, and time.  Psychiatric:        Behavior: Behavior normal.     ED Results / Procedures / Treatments   Labs (all labs ordered are listed, but only abnormal results are displayed) Labs Reviewed  RESP PANEL BY RT-PCR (RSV, FLU A&B, COVID)  RVPGX2  CBC  COMPREHENSIVE METABOLIC PANEL    EKG None  Radiology No  results found.  Procedures Procedures  {Document cardiac monitor, telemetry assessment procedure when appropriate:1}  Medications Ordered in ED Medications - No data to display  ED Course/ Medical Decision Making/ A&P   {   Click here for ABCD2, HEART and other calculatorsREFRESH Note before signing :1}                          Medical Decision Making Amount and/or Complexity of Data Reviewed Labs: ordered. Radiology: ordered.   ***  {Document critical care time when appropriate:1} {Document review of labs and clinical decision tools ie heart score, Chads2Vasc2 etc:1}  {Document your independent review of radiology images, and any outside records:1} {Document your discussion with family members, caretakers, and with consultants:1} {Document  social determinants of health affecting pt's care:1} {Document your decision making why or why not admission, treatments were needed:1} Final Clinical Impression(s) / ED Diagnoses Final diagnoses:  None    Rx / DC Orders ED Discharge Orders     None

## 2022-08-24 NOTE — ED Triage Notes (Signed)
Arrives POV after notification from PCP that labs obtained today were abnormal.   Went for follow up for ongoing CP, suspected lingering PNA, and shob. Has had pneumonia x 4 weeks and 2 separate courses of abx.   Was notified this evening that D-Dimer was elevated.   Hx of Huntington's Disease.

## 2022-08-25 ENCOUNTER — Other Ambulatory Visit: Payer: Self-pay | Admitting: *Deleted

## 2022-08-25 ENCOUNTER — Emergency Department (HOSPITAL_COMMUNITY): Payer: Medicare PPO

## 2022-08-25 LAB — RESP PANEL BY RT-PCR (RSV, FLU A&B, COVID)  RVPGX2
Influenza A by PCR: NEGATIVE
Influenza B by PCR: NEGATIVE
Resp Syncytial Virus by PCR: NEGATIVE
SARS Coronavirus 2 by RT PCR: NEGATIVE

## 2022-08-25 LAB — TROPONIN I (HIGH SENSITIVITY): Troponin I (High Sensitivity): 2 ng/L (ref <18)

## 2022-08-25 MED ORDER — IOHEXOL 350 MG/ML SOLN
75.0000 mL | Freq: Once | INTRAVENOUS | Status: AC | PRN
  Administered 2022-08-25: 75 mL via INTRAVENOUS

## 2022-08-25 NOTE — ED Notes (Signed)
RN brought water to pt w/ provider's permission, however was told by spouse that "she chokes on water."  Juice given to pt per spouse request.

## 2022-08-25 NOTE — Telephone Encounter (Signed)
RX request Hailey 24 FE 1-20 mg-mcg   Last OV 03/27/2021 Upcoming Annual exam 10/29/2022  Pt request enough till visit. Routing to Provider for approval/denial.

## 2022-08-26 MED ORDER — HAILEY 24 FE 1-20 MG-MCG(24) PO TABS: 1.0000 | ORAL_TABLET | Freq: Every day | ORAL | 0 refills | Status: DC

## 2022-09-09 ENCOUNTER — Ambulatory Visit (HOSPITAL_COMMUNITY)
Admission: RE | Admit: 2022-09-09 | Discharge: 2022-09-09 | Disposition: A | Discharge status: Home / Self Care | Payer: Medicare PPO | Source: Ambulatory Visit | Attending: Family Medicine | Admitting: Family Medicine

## 2022-09-09 DIAGNOSIS — R1312 Dysphagia, oropharyngeal phase: Secondary | ICD-10-CM | POA: Diagnosis not present

## 2022-09-09 DIAGNOSIS — R1310 Dysphagia, unspecified: Secondary | ICD-10-CM

## 2022-09-09 DIAGNOSIS — G10 Huntington's disease: Secondary | ICD-10-CM | POA: Diagnosis present

## 2022-10-16 LAB — COLOGUARD: COLOGUARD: NEGATIVE

## 2022-10-29 ENCOUNTER — Encounter: Payer: Self-pay | Admitting: Obstetrics & Gynecology

## 2022-10-29 ENCOUNTER — Ambulatory Visit (INDEPENDENT_AMBULATORY_CARE_PROVIDER_SITE_OTHER): Payer: Medicare PPO | Admitting: Obstetrics & Gynecology

## 2022-10-29 VITALS — BP 120/78 | HR 96

## 2022-10-29 DIAGNOSIS — Z3009 Encounter for other general counseling and advice on contraception: Secondary | ICD-10-CM | POA: Diagnosis not present

## 2022-10-29 DIAGNOSIS — N951 Menopausal and female climacteric states: Secondary | ICD-10-CM

## 2022-10-29 DIAGNOSIS — Z8742 Personal history of other diseases of the female genital tract: Secondary | ICD-10-CM | POA: Diagnosis not present

## 2022-10-29 DIAGNOSIS — G10 Huntington's disease: Secondary | ICD-10-CM | POA: Diagnosis not present

## 2022-10-29 DIAGNOSIS — Z9189 Other specified personal risk factors, not elsewhere classified: Secondary | ICD-10-CM

## 2022-10-29 DIAGNOSIS — Z01419 Encounter for gynecological examination (general) (routine) without abnormal findings: Secondary | ICD-10-CM

## 2022-10-29 NOTE — Progress Notes (Signed)
Barbara Gallegos 05/08/1971 [id number]   History:    52 y.o. G2P2L2.  Married   RP:  Established patient presenting for annual gyn exam    HPI: On Hailey 24 Fe 1/20. No menses.  No BTB. Severe hot flushes/night sweats. No pelvic pain. No abnormal discharge. Abstinent.  Pap Neg 03/2021. Will repeat Pap at 3 years. Urine/BMs normal.  Breasts normal.  Mammo Neg 04/2015. Will schedule mammo now. Health labs with Fam MD. Huntington's disease with frequent falls, worsening.  COLOGUARD in 09/2022 Neg.   Past medical history,surgical history, family history and social history were all reviewed and documented in the EPIC chart.  Gynecologic History No LMP recorded. (Menstrual status: Irregular Periods).  Obstetric History OB History  Gravida Para Term Preterm AB Living  SAB IAB Ectopic Multiple Live Births               # Outcome Date GA Lbr Len/2nd Weight Sex Delivery Anes PTL Lv  2 Term           1 Term              ROS: A ROS was performed and pertinent positives and negatives are included in the history. GENERAL: No fevers or chills. HEENT: No change in vision, no earache, sore throat or sinus congestion. NECK: No pain or stiffness. CARDIOVASCULAR: No chest pain or pressure. No palpitations. PULMONARY: No shortness of breath, cough or wheeze. GASTROINTESTINAL: No abdominal pain, nausea, vomiting or diarrhea, melena or bright red blood per rectum. GENITOURINARY: No urinary frequency, urgency, hesitancy or dysuria. MUSCULOSKELETAL: No joint or muscle pain, no back pain, no recent trauma. DERMATOLOGIC: No rash, no itching, no lesions. ENDOCRINE: No polyuria, polydipsia, no heat or cold intolerance. No recent change in weight. HEMATOLOGICAL: No anemia or easy bruising or bleeding. NEUROLOGIC: No headache, seizures, numbness, tingling or weakness. PSYCHIATRIC: No depression, no loss of interest in normal activity or change in sleep pattern.     Exam:   BP 120/78   Pulse 96    SpO2 97%   There is no height or weight on file to calculate BMI.  General appearance : Well developed well nourished female. No acute distress  Patient had high anxiety.  Chaperon, Cornelia Copa, present during the Breast and Pelvic exam:  Breast:Examined in sitting and supine position were symmetrical in appearance, no palpable masses or tenderness,  no skin retraction, no nipple inversion, no nipple discharge, no skin discoloration, no axillary or supraclavicular lymphadenopathy  Abdomen: no palpable masses or tenderness, no rebound or guarding Extremities: no edema or skin discoloration or tenderness  Pelvic: Vulva: Normal             Vagina: No gross lesions or discharge  Cervix: No gross lesions or discharge  Uterus  AV, normal size, shape and consistency, non-tender and mobile  Adnexa  Without masses or tenderness  Anus: Normal   Assessment/Plan:  52 y.o. female for annual exam   1. Well female exam with routine gynecological exam On Hailey 24 Fe 1/20. No menses.  No BTB. Severe hot flushes/night sweats. No pelvic pain. No abnormal discharge. Abstinent.  Pap Neg 03/2021. Will repeat Pap at 3 years. Urine/BMs normal.  Breasts normal.  Mammo Neg 04/2015. Will schedule mammo now. Health labs with Fam MD. Huntington's disease with frequent falls, worsening.  COLOGUARD in 09/2022 Neg.   2. Encounter for other general counseling or advice on contraception Abstinent  3. Hot flushes, perimenopausal Stop BCPs now.  Will f/u in 2 weeks for [facility] and counseling on HRT. - [facility]; Future   Genia Del MD, 3:03 PM

## 2022-11-09 ENCOUNTER — Encounter (HOSPITAL_COMMUNITY): Payer: Self-pay

## 2022-11-09 ENCOUNTER — Emergency Department (HOSPITAL_COMMUNITY): Payer: Medicare PPO

## 2022-11-09 ENCOUNTER — Emergency Department (HOSPITAL_COMMUNITY)
Admission: EM | Admit: 2022-11-09 | Discharge: 2022-11-09 | Disposition: A | Discharge status: Home / Self Care | Payer: Medicare PPO | Attending: Emergency Medicine | Admitting: Emergency Medicine

## 2022-11-09 ENCOUNTER — Other Ambulatory Visit: Payer: Self-pay

## 2022-11-09 DIAGNOSIS — W050XXA Fall from non-moving wheelchair, initial encounter: Secondary | ICD-10-CM | POA: Diagnosis not present

## 2022-11-09 DIAGNOSIS — S8002XA Contusion of left knee, initial encounter: Secondary | ICD-10-CM | POA: Insufficient documentation

## 2022-11-09 DIAGNOSIS — Y92009 Unspecified place in unspecified non-institutional (private) residence as the place of occurrence of the external cause: Secondary | ICD-10-CM | POA: Diagnosis not present

## 2022-11-09 DIAGNOSIS — M25562 Pain in left knee: Secondary | ICD-10-CM

## 2022-11-09 DIAGNOSIS — W19XXXA Unspecified fall, initial encounter: Secondary | ICD-10-CM

## 2022-11-09 HISTORY — DX: Amyotrophic lateral sclerosis: G12.21

## 2022-11-09 MED ORDER — ACETAMINOPHEN 325 MG PO TABS
650.0000 mg | ORAL_TABLET | Freq: Once | ORAL | Status: AC
  Administered 2022-11-09: 650 mg via ORAL
  Filled 2022-11-09: qty 2

## 2022-11-09 NOTE — ED Provider Notes (Signed)
[place] EMERGENCY DEPARTMENT AT [facility] Provider Note   CSN: [id number] Arrival date & time: 11/09/22  1607     History  Chief Complaint  Patient presents with   Fall    Barbara Gallegos is a 52 y.o. female, history of Huntington's disease, who fell out of her wheelchair, at home.  She denies any head injury, neck pain, back pain, only states that her left knee hurts.  Is not on any blood thinners.     Home Medications Prior to Admission medications   Medication Sig Start Date End Date Taking? Authorizing Provider  albuterol (ACCUNEB) 1.25 MG/3ML nebulizer solution USE 1 VIAL IN NEBULIZER EVERY 6 HOURS AS NEEDED FOR WHEEZING    [provider]  albuterol (VENTOLIN HFA) 108 (90 Base) MCG/ACT inhaler Inhale into the lungs. 07/30/22   [provider]  AUSTEDO 12 MG TABS Take 1 tablet by mouth 2 (two) times daily.    [provider]  benzonatate (TESSALON) 100 MG capsule Take 1 capsule (100 mg total) by mouth 3 (three) times daily as needed for cough. 06/19/21   Mesner, Barbara Cower, MD  budesonide (PULMICORT) 1 MG/2ML nebulizer solution SMARTSIG:1 Via Nebulizer Twice Daily    [provider]  cetirizine (ZYRTEC) 10 MG tablet Take 1 tablet by mouth daily. 01/07/22   [provider]  Multiple Vitamin (MULTI-VITAMIN) tablet Take 1 tablet by mouth daily.    [provider]  Norethindrone Acetate-Ethinyl Estrad-FE (HAILEY 24 FE) 1-20 MG-MCG(24) tablet Take 1 tablet by mouth daily. 08/26/22   Wyline Beady A, NP  risperiDONE (RISPERDAL) 0.25 MG tablet Take 0.25 mg by mouth every morning.    [provider]  Spacer/Aero-Holding Chambers (AEROCHAMBER PLUS FLO-VU LARGE) DEVI SMARTSIG:Inhaler(s) Via Inhaler PRN 10/01/22   [provider]  Travoprost, BAK Free, (TRAVATAN) 0.004 % SOLN ophthalmic solution SMARTSIG:1 Drop(s) In Eye(s) Every Evening    [provider]  traZODone (DESYREL) 50 MG tablet Take 25 mg by  mouth at bedtime as needed. 11/25/20   [provider]  venlafaxine XR (EFFEXOR-XR) 75 MG 24 hr capsule Take by mouth. 10/01/22   [provider]  omeprazole (PRILOSEC) 40 MG capsule Take 1 capsule (40 mg total) by mouth daily. 07/27/19 05/27/20  Rachael Fee, MD      Allergies    Codeine and Other    Review of Systems   Review of Systems  Musculoskeletal:        +L knee pain  Skin:  Negative for wound.    Physical Exam Updated Vital Signs BP (!) 151/77 (BP Location: Right Arm)   Pulse (!) 117   Resp 20   Ht  (1.651 m)   Wt 81.6 kg   SpO2 97%   BMI 29.95 kg/m  Physical Exam Vitals and nursing note reviewed.  Constitutional:      General: She is not in acute distress.    Appearance: She is well-developed.  HENT:     Head: Normocephalic and atraumatic.  Eyes:     General:        Right eye: No discharge.        Left eye: No discharge.     Conjunctiva/sclera: Conjunctivae normal.  Pulmonary:     Effort: No respiratory distress.  Musculoskeletal:     Comments: Left Knee: Tenderness to palpation of medial joint line. An effusion is not present.  Negative anterior and posterior drawer. Negative Mcmurray's. +Patellar stability. Negative valgus and varus stress test..  Extension and flexion intact. No sensory deficits.    Skin:    Comments: +ecchymoses of L knee  Neurological:     Mental Status: She is alert.     Comments: Clear speech.   Psychiatric:        Behavior: Behavior normal.        Thought Content: Thought content normal.     ED Results / Procedures / Treatments   Labs (all labs ordered are listed, but only abnormal results are displayed) Labs Reviewed - No data to display  EKG None  Radiology DG Knee Complete 4 Views Left  Result Date: 11/09/2022 CLINICAL DATA:  Trauma EXAM: LEFT KNEE - COMPLETE 4 VIEW COMPARISON:  None Available. FINDINGS: No fracture. No dislocation. Normal alignment. No joint effusion. No radiopaque foreign  body. IMPRESSION: No fracture or dislocation. Electronically Signed   By: Lorenza Cambridge M.D.   On: 11/09/2022 17:43    Procedures Procedures    Medications Ordered in ED Medications  acetaminophen (TYLENOL) tablet 650 mg (650 mg Oral Given 11/09/22 1736)    ED Course/ Medical Decision Making/ A&P Clinical Course as of 11/09/22 1837  Tue Nov 09, 2022  1801 DG Knee Complete 4 Views Left [BS]    Clinical Course User Index [BS] Adrienna Karis, Harley Alto, PA                             Medical Decision Making Patient is a 52 year old female, who fell out of her wheelchair at home.  She does not have any head injury, neck pain, back pain, only states that she has left knee pain.  She has a bruising to her left knee, with no open wound, and tenderness to palpation of the medial joint line.  Will obtain an x-ray of her knee, for further evaluation.  She has no joint laxity  Amount and/or Complexity of Data Reviewed Radiology: ordered. Decision-making details documented in ED Course.    Details: No acute findings Discussion of management or test interpretation with external provider(s): Discussed with patient, x-rays unremarkable, likely contusion from the fall.  We discussed return precautions, and she voiced understanding.  She is wheelchair-bound so we did not attempt a walker.  She does appear to have good strength of her legs, and no deficits.  Return precaution emphasized.  Risk OTC drugs.   Final Clinical Impression(s) / ED Diagnoses Final diagnoses:  Fall, initial encounter  Acute pain of left knee    Rx / DC Orders ED Discharge Orders     None         Gabi Mcfate, Harley Alto, PA 11/09/22 1837    Gwyneth Sprout, MD 11/10/22 0001
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
## 2022-11-09 NOTE — Discharge Instructions (Addendum)
Today your x-ray was reassuring there shows no fracture.  Please follow-up with your primary care doctor, if it is still bothering you.  You can use Tylenol and ibuprofen to help with this, as well as ice.  You can also use an Ace bandage to provide more support.  If you develop severe swelling, warmth, redness to your leg please return to the ER.

## 2022-11-09 NOTE — ED Notes (Signed)
Assumed care of this pt. Pt is at her baseline for her condition. Pt is complaining of left knee pain from a fall out of her wheelchair.  Pt has equal chest rise and fall. Pt is in not obvious distress at this time.

## 2022-11-12 ENCOUNTER — Ambulatory Visit: Payer: Medicare PPO | Admitting: Obstetrics & Gynecology

## 2022-11-12 ENCOUNTER — Encounter: Payer: Self-pay | Admitting: Obstetrics & Gynecology

## 2022-11-12 VITALS — BP 148/78

## 2022-11-12 DIAGNOSIS — N951 Menopausal and female climacteric states: Secondary | ICD-10-CM | POA: Diagnosis not present

## 2022-11-12 DIAGNOSIS — G10 Huntington's disease: Secondary | ICD-10-CM | POA: Diagnosis not present

## 2022-11-12 MED ORDER — ESTRADIOL 0.1 MG/24HR TD PTWK: 0.1000 mg | MEDICATED_PATCH | TRANSDERMAL | 4 refills | Status: AC

## 2022-11-12 MED ORDER — PROGESTERONE MICRONIZED 100 MG PO CAPS: 100.0000 mg | ORAL_CAPSULE | Freq: Every day | ORAL | 4 refills | Status: AC

## 2022-11-12 NOTE — Progress Notes (Signed)
    Barbara Gallegos [birth year] [id number]        52 y.o.  G2P2002   RP: Counseling on Menopausal syndrome  HPI: Stopped Hailey 24 Fe 1/20 on 10/29/2022.  No menses.  No BTB. Already had severe hot flushes/night sweats and they got worse after stopping the BCPs. No pelvic pain. No abnormal discharge. Abstinent.  No CI to HRT.  Huntington's disease with frequent falls using a walker.  Doing PT daily.    OB History  Gravida Para Term Preterm AB Living  2 2 2     2   SAB IAB Ectopic Multiple Live Births               # Outcome Date GA Lbr Len/2nd Weight Sex Delivery Anes PTL Lv  2 Term           1 Term             Past medical history,surgical history, problem list, medications, allergies, family history and social history were all reviewed and documented in the EPIC chart.   Directed ROS with pertinent positives and negatives documented in the history of present illness/assessment and plan.  Exam:  There were no vitals filed for this visit. General appearance:  Normal  Gynecologic exam: Deferred, normal on 10/29/22.   Assessment/Plan:  52 y.o. G2P2002   1. Menopausal syndrome Stopped Hailey 24 Fe 1/20 on 10/29/2022.  No menses.  No BTB. Already had severe hot flushes/night sweats and they got worse after stopping the BCPs. No pelvic pain. No abnormal discharge. Abstinent.  No CI to HRT. Risks and benefits thoroughly reviewed.  Usage discussed and prescriptions sent to pharmacy. - [facility]  2. Huntington disease (HCC)  Huntington's disease with frequent falls using a walker.  Doing PT daily.   Other orders - sertraline (ZOLOFT) 100 MG tablet; Take by mouth. - estradiol (CLIMARA - DOSED IN MG/24 HR) 0.1 mg/24hr patch; Place 1 patch (0.1 mg total) onto the skin once a week. - progesterone (PROMETRIUM) 100 MG capsule; Take 1 capsule (100 mg total) by mouth at bedtime.   Genia Del MD, 1:57 PM 11/12/2022

## 2022-11-13 LAB — FOLLICLE STIMULATING HORMONE: FSH: 44.2 m[IU]/mL

## 2022-12-14 ENCOUNTER — Emergency Department (HOSPITAL_COMMUNITY): Payer: Medicare PPO

## 2022-12-14 ENCOUNTER — Emergency Department (HOSPITAL_COMMUNITY)
Admission: EM | Admit: 2022-12-14 | Discharge: 2022-12-17 | Disposition: A | Discharge status: Home / Self Care | Payer: Medicare PPO | Attending: Emergency Medicine | Admitting: Emergency Medicine

## 2022-12-14 ENCOUNTER — Other Ambulatory Visit: Payer: Self-pay

## 2022-12-14 DIAGNOSIS — M6281 Muscle weakness (generalized): Secondary | ICD-10-CM | POA: Insufficient documentation

## 2022-12-14 DIAGNOSIS — R062 Wheezing: Secondary | ICD-10-CM | POA: Diagnosis not present

## 2022-12-14 DIAGNOSIS — G10 Huntington's disease: Secondary | ICD-10-CM

## 2022-12-14 DIAGNOSIS — R627 Adult failure to thrive: Secondary | ICD-10-CM | POA: Insufficient documentation

## 2022-12-14 DIAGNOSIS — Z20822 Contact with and (suspected) exposure to covid-19: Secondary | ICD-10-CM | POA: Insufficient documentation

## 2022-12-14 DIAGNOSIS — Z1330 Encounter for screening examination for mental health and behavioral disorders, unspecified: Secondary | ICD-10-CM | POA: Diagnosis not present

## 2022-12-14 DIAGNOSIS — R29898 Other symptoms and signs involving the musculoskeletal system: Secondary | ICD-10-CM | POA: Insufficient documentation

## 2022-12-14 DIAGNOSIS — R2689 Other abnormalities of gait and mobility: Secondary | ICD-10-CM | POA: Diagnosis not present

## 2022-12-14 DIAGNOSIS — R0602 Shortness of breath: Secondary | ICD-10-CM | POA: Diagnosis not present

## 2022-12-14 DIAGNOSIS — R2681 Unsteadiness on feet: Secondary | ICD-10-CM | POA: Insufficient documentation

## 2022-12-14 DIAGNOSIS — Z7951 Long term (current) use of inhaled steroids: Secondary | ICD-10-CM | POA: Insufficient documentation

## 2022-12-14 LAB — BASIC METABOLIC PANEL
Anion gap: 10 (ref 5–15)
BUN: 16 mg/dL (ref 6–20)
CO2: 24 mmol/L (ref 22–32)
Calcium: 9 mg/dL (ref 8.9–10.3)
Chloride: 105 mmol/L (ref 98–111)
Creatinine, Ser: 0.69 mg/dL (ref 0.44–1.00)
GFR, Estimated: >60 mL/min (ref >60)
Glucose, Bld: 133 mg/dL — ABNORMAL HIGH (ref 70–99)
Potassium: 3.7 mmol/L (ref 3.5–5.1)
Sodium: 139 mmol/L (ref 135–145)

## 2022-12-14 LAB — CBC
HCT: 43 % (ref 36.0–46.0)
Hemoglobin: 14.1 g/dL (ref 12.0–15.0)
MCH: 28.1 pg (ref 26.0–34.0)
MCHC: 32.8 g/dL (ref 30.0–36.0)
MCV: 85.8 fL (ref 80.0–100.0)
Platelets: 294 10*3/uL (ref 150–400)
RBC: 5.01 MIL/uL (ref 3.87–5.11)
RDW: 13.3 % (ref 11.5–15.5)
WBC: 8.4 10*3/uL (ref 4.0–10.5)
nRBC: 0 % (ref 0.0–0.2)

## 2022-12-14 LAB — RESP PANEL BY RT-PCR (RSV, FLU A&B, COVID)  RVPGX2
Influenza A by PCR: NEGATIVE
Influenza B by PCR: NEGATIVE
Resp Syncytial Virus by PCR: NEGATIVE
SARS Coronavirus 2 by RT PCR: NEGATIVE

## 2022-12-14 LAB — URINALYSIS, ROUTINE W REFLEX MICROSCOPIC
Bilirubin Urine: NEGATIVE
Glucose, UA: NEGATIVE mg/dL
Ketones, ur: 5 mg/dL — AB
Nitrite: NEGATIVE
Protein, ur: NEGATIVE mg/dL
Specific Gravity, Urine: 1.021 (ref 1.005–1.030)
WBC, UA: >50 WBC/hpf (ref 0–5)
pH: 5 (ref 5.0–8.0)

## 2022-12-14 MED ORDER — TRAZODONE HCL 50 MG PO TABS
25.0000 mg | ORAL_TABLET | Freq: Every evening | ORAL | Status: DC | PRN
  Administered 2022-12-14: 25 mg via ORAL
  Filled 2022-12-14: qty 1

## 2022-12-14 NOTE — ED Notes (Signed)
Pt resting in bed with no acute distress noted at this time.  

## 2022-12-14 NOTE — Progress Notes (Signed)
[facility] case has been screened in and a Child psychotherapist has been assigned.
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
## 2022-12-14 NOTE — ED Notes (Signed)
Pt verbally consented to allow her reasons for this visit to be discussed with her visiting friend.

## 2022-12-14 NOTE — Progress Notes (Addendum)
CSW spoke to the patient at the bedside about Finances as well as updated contact information.   At this time the patient reports that she receives 4000 a month and reports having 1000 in savings. This CSW repeated amount's to the patient to clarify. This CSW also explained to the patient that at this time [facility] has screened the patient in and explained to the patient that a Child psychotherapist from [facility]PS should be reaching out within a few days.   The patient asked this CSW about how she will get her wheel chair and stated that we can speak to her husband regarding the wheelchair. At this time nurses/ staff/ providers should stay cautions about updated information that they relay to the husband due to the certain issues within the home.  Please check with the patient first. This CSW read out contact numbers including the patient's to ensure they are correct. The patient did confirm that the number listed are correct. Patient asked about when she would be able to be put in a room. This CSW did inform the patient's nurse that the patient would like a room. TOC will continue to follow.
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
## 2022-12-14 NOTE — ED Notes (Signed)
Dorma Russell - [phone] Pt's sister requests if possible update when available from practioner(s)

## 2022-12-14 NOTE — ED Notes (Signed)
Provider spoke with patient about XR and CT scans and the patient stated that she did not wish to have them performed.
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
## 2022-12-15 ENCOUNTER — Encounter (HOSPITAL_COMMUNITY): Payer: Self-pay

## 2022-12-15 MED ORDER — LATANOPROST 0.005 % OP SOLN
1.0000 [drp] | Freq: Every day | OPHTHALMIC | Status: DC
  Administered 2022-12-15 – 2022-12-16 (×3): 1 [drp] via OPHTHALMIC
  Filled 2022-12-15: qty 2.5

## 2022-12-15 MED ORDER — PROGESTERONE MICRONIZED 100 MG PO CAPS
100.0000 mg | ORAL_CAPSULE | Freq: Every day | ORAL | Status: DC
  Administered 2022-12-15 – 2022-12-16 (×3): 100 mg via ORAL
  Filled 2022-12-15 (×3): qty 1

## 2022-12-15 MED ORDER — ALBUTEROL SULFATE HFA 108 (90 BASE) MCG/ACT IN AERS: 1.0000 | INHALATION_SPRAY | Freq: Four times a day (QID) | RESPIRATORY_TRACT | Status: DC | PRN

## 2022-12-15 MED ORDER — RISPERIDONE 0.5 MG PO TABS
0.2500 mg | ORAL_TABLET | Freq: Every morning | ORAL | Status: DC
  Administered 2022-12-15 – 2022-12-17 (×3): 0.25 mg via ORAL
  Filled 2022-12-15 (×3): qty 1

## 2022-12-15 MED ORDER — BENZONATATE 100 MG PO CAPS: 100.0000 mg | ORAL_CAPSULE | Freq: Three times a day (TID) | ORAL | Status: DC | PRN

## 2022-12-15 MED ORDER — SERTRALINE HCL 50 MG PO TABS: 200.0000 mg | ORAL_TABLET | Freq: Every morning | ORAL | Status: DC

## 2022-12-15 MED ORDER — NAPHAZOLINE-GLYCERIN 0.012-0.25 % OP SOLN: 1.0000 [drp] | Freq: Three times a day (TID) | OPHTHALMIC | Status: DC | PRN

## 2022-12-15 MED ORDER — BUDESONIDE 0.5 MG/2ML IN SUSP
1.0000 mg | Freq: Every day | RESPIRATORY_TRACT | Status: DC
  Administered 2022-12-15 – 2022-12-16 (×2): 1 mg via RESPIRATORY_TRACT
  Filled 2022-12-15 (×2): qty 4

## 2022-12-15 MED ORDER — DEUTETRABENAZINE 12 MG PO TABS
12.0000 mg | ORAL_TABLET | Freq: Two times a day (BID) | ORAL | Status: DC
  Administered 2022-12-15 – 2022-12-17 (×3): 12 mg via ORAL

## 2022-12-15 MED ORDER — SERTRALINE HCL 50 MG PO TABS
200.0000 mg | ORAL_TABLET | Freq: Every day | ORAL | Status: DC
  Administered 2022-12-15 – 2022-12-17 (×3): 200 mg via ORAL
  Filled 2022-12-15 (×3): qty 4

## 2022-12-15 MED ORDER — ALBUTEROL SULFATE 1.25 MG/3ML IN NEBU: 1.0000 | INHALATION_SOLUTION | RESPIRATORY_TRACT | Status: DC

## 2022-12-15 MED ORDER — LORATADINE 10 MG PO TABS
10.0000 mg | ORAL_TABLET | Freq: Every day | ORAL | Status: DC
  Administered 2022-12-15 – 2022-12-17 (×3): 10 mg via ORAL
  Filled 2022-12-15 (×3): qty 1

## 2022-12-15 MED ORDER — ALBUTEROL SULFATE (2.5 MG/3ML) 0.083% IN NEBU: 2.5000 mg | INHALATION_SOLUTION | Freq: Four times a day (QID) | RESPIRATORY_TRACT | Status: DC | PRN

## 2022-12-15 MED ORDER — ESTRADIOL 0.1 MG/24HR TD PTWK
0.1000 mg | MEDICATED_PATCH | TRANSDERMAL | Status: DC
  Administered 2022-12-15: 0.1 mg via TRANSDERMAL
  Filled 2022-12-15: qty 1

## 2022-12-15 NOTE — Progress Notes (Addendum)
CSW has reached out to the provider to have the patient seen by psych to see about the patient competency.TOC will continue to follow the patient.

## 2022-12-15 NOTE — Progress Notes (Deleted)
30 Day PASRR Note   Patient Details  Name: Barbara Gallegos Date of Birth: February 24, 1971   Transition of Care [facility]) CM/SW Contact:    Princella Ion, LCSW Phone Number: 12/15/2022, 10:17 AM  To Whom It May Concern:  Please be advised that this patient will require a short-term nursing home stay - anticipated 30 days or less for rehabilitation and strengthening.   The plan is for return home.
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
## 2022-12-15 NOTE — Progress Notes (Signed)
PASRR: [id number]

## 2022-12-15 NOTE — ED Notes (Signed)
RN s/w visitor who stated she will contact patient spouse to get Deutetrabenazine 12mg  and bring medication back for patient.

## 2022-12-15 NOTE — ED Notes (Signed)
Patient sister Tresa Endo called and updated on patient status
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
## 2022-12-15 NOTE — ED Notes (Addendum)
Mr and Barbara Gallegos found these to facilities that they are considering for her placement at discharge. 1) [facility] [place] [address] [phone] 2) [facility] [address] [place], [place] [address] [phone]
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
## 2022-12-15 NOTE — Progress Notes (Addendum)
Pt faxed out, bed offers pending.   Addend @ 11:03 AM This CSW was provided pt's Child psychotherapist at [employer] by EDP. This CSW outreached to [place], [phone]. Revonda Standard provided past history of pt and wanted to clarify pt does NOT have ALS. Revonda Standard reports she has known pt for 10+ years and has worked very closely with the pt and her family. Revonda Standard provided two HD advocates that work with the pt and her husband closely: Olegario Messier - [phone] and Aldean [phone]. Revonda Standard reports she has a release for [facility] that does not expire. She reported Olegario Messier has visited the pt since she has been here.   Revonda Standard expressed difficulty making progress with getting the pt placed. She did state that the pt makes too much money and does not qualify for Medicaid. Revonda Standard will provide this CSW with a list of facilities that usually take HD pts via email. Revonda Standard states she is happy to speak with the pt at any time if her assistance is needed.   Addend @ 11:36 AM This CSW staffed with EDP who informs pt may benefit from outpatient palliative referral. Sonia Baller, social worker at [facility] informed they'd referred pt to [facility] previously. This CSW reviewed pt's chart and it appears pt was previously connected to [facility]. This CSW outreached to [facility] on call RN to inquire about whether pt is currently active and re-connecting pt with services. Doreatha Martin, RN to review.
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
## 2022-12-15 NOTE — Progress Notes (Signed)
   12/15/22 1600  Spiritual Encounters  Type of Visit Follow up  Care provided to: Patient  Conversation partners present during encounter Social worker/Care management/TOC;Nurse  Referral source Social worker/Care management/TOC  Reason for visit Advance directives  OnCall Visit No  Spiritual Framework  Presenting Themes Significant life change  Community/Connection Family  Interventions  Spiritual Care Interventions Made Compassionate presence;Reflective listening;Encouragement  Advance Directives (For Healthcare)  Does Patient Have a Medical Advance Directive? No  Would patient like information on creating a medical advance directive? Yes (Inpatient - patient requests chaplain consult to create a medical advance directive) (CSW is unsure of patient's current capacity)   Chaplain consulted with CSW Myer Haff before entering the patient's room. Patient's capacity for decision making is currently in question. Chaplain provided HCPOA education to patient and provided paperwork. Patient stated she wanted her spouse to be her decision maker. No paperwork was completed because the patient's capacity is currently in question. Chaplain let patient know that her spouse would automatically be her HCPOA according to state law.   Arlyce Dice, Chaplain Resident

## 2022-12-15 NOTE — NC FL2 (Signed)
[place] MEDICAID FL2 LEVEL OF CARE FORM     IDENTIFICATION  Patient Name: Barbara Gallegos Birthdate: [birth year] Sex: female Admission Date (Current Location): 12/14/2022  [facility] and [place] Number:  Producer, television/film/video and Address:  [facility],  [address], [place] [address]      Provider Number: [id number]  Attending Physician Name and Address:  Elayne Snare, DO  Relative Name and Phone Number:       Current Level of Care: Hospital Recommended Level of Care: Skilled Nursing Facility Prior Approval Number:    Date Approved/Denied:   PASRR Number: PENDING  Discharge Plan: SNF    Current Diagnoses: There are no problems to display for this patient.   Orientation RESPIRATION BLADDER Height & Weight     Self, Time, Situation, Place  Normal Incontinent Weight: 179 lb 14.3 oz (81.6 kg) Height:  5\' 5"  (165.1 cm)  BEHAVIORAL SYMPTOMS/MOOD NEUROLOGICAL BOWEL NUTRITION STATUS      Incontinent Diet (Regular)  AMBULATORY STATUS COMMUNICATION OF NEEDS Skin   Limited Assist Verbally Normal                       Personal Care Assistance Level of Assistance  Bathing, Feeding, Dressing Bathing Assistance: Limited assistance Feeding assistance: Limited assistance Dressing Assistance: Limited assistance     Functional Limitations Info  Sight, Hearing, Speech Sight Info: Impaired (wears glasses) Hearing Info: Adequate Speech Info: Impaired    SPECIAL CARE FACTORS FREQUENCY  PT (By licensed PT), OT (By licensed OT)     PT Frequency: x5/week OT Frequency: x5/week            Contractures Contractures Info: Not present    Additional Factors Info  Code Status, Allergies, Psychotropic Code Status Info: Not on file Allergies Info: Codeine  Other  Venlafaxine Psychotropic Info: Risperdal, Zoloft         Current Medications (12/15/2022):  This is the current hospital active medication list Current Facility-Administered Medications   Medication Dose Route Frequency Provider Last Rate Last Admin   albuterol (PROVENTIL) (2.5 MG/3ML) 0.083% nebulizer solution 2.5 mg  2.5 mg Nebulization Q6H PRN Sabas Sous, MD       benzonatate (TESSALON) capsule 100 mg  100 mg Oral TID PRN Sabas Sous, MD       budesonide (PULMICORT) nebulizer solution 1 mg  1 mg Nebulization QHS Sabas Sous, MD       Deutetrabenazine TABS 12 mg  12 mg Oral BID Sabas Sous, MD       estradiol [facility] - Dosed in mg/24 hr) patch 0.1 mg  0.1 mg Transdermal Weekly Sabas Sous, MD   0.1 mg at 12/15/22 0941   latanoprost (XALATAN) 0.005 % ophthalmic solution 1 drop  1 drop Both Eyes QHS Sabas Sous, MD   1 drop at 12/15/22 0112   loratadine (CLARITIN) tablet 10 mg  10 mg Oral Daily Sabas Sous, MD   10 mg at 12/15/22 0941   naphazoline-glycerin (CLEAR EYES REDNESS) ophth solution 1 drop  1 drop Both Eyes TID PRN Sabas Sous, MD       progesterone (PROMETRIUM) capsule 100 mg  100 mg Oral QHS Sabas Sous, MD   100 mg at 12/15/22 0112   risperiDONE (RISPERDAL) tablet 0.25 mg  0.25 mg Oral q morning Sabas Sous, MD   0.25 mg at 12/15/22 0941   sertraline (ZOLOFT) tablet 200 mg  200 mg Oral  Daily Sabas Sous, MD   200 mg at 12/15/22 0941   traZODone (DESYREL) tablet 25 mg  25 mg Oral QHS PRN Maia Plan, MD   25 mg at 12/14/22 2228   Current Outpatient Medications  Medication Sig Dispense Refill   albuterol (ACCUNEB) 1.25 MG/3ML nebulizer solution Take 1 ampule by nebulization See admin instructions. Nebulize 1 ampule at bedtime and an additional 1 ampule every six hours as needed for wheezing     albuterol (VENTOLIN HFA) 108 (90 Base) MCG/ACT inhaler Inhale 1-2 puffs into the lungs every 6 (six) hours as needed for wheezing or shortness of breath.     AUSTEDO 12 MG TABS Take 12 mg by mouth 2 (two) times daily.     benzonatate (TESSALON) 100 MG capsule Take 1 capsule (100 mg total) by mouth 3 (three) times daily as needed  for cough. 21 capsule 0   budesonide (PULMICORT) 1 MG/2ML nebulizer solution Take 1 mg by nebulization at bedtime.     cetirizine (ZYRTEC) 10 MG tablet Take 10 mg by mouth at bedtime.     estradiol (CLIMARA - DOSED IN MG/24 HR) 0.1 mg/24hr patch Place 1 patch (0.1 mg total) onto the skin once a week. (Patient taking differently: Place 0.1 mg onto the skin every Tuesday.) 24 patch 4   progesterone (PROMETRIUM) 100 MG capsule Take 1 capsule (100 mg total) by mouth at bedtime. 90 capsule 4   risperiDONE (RISPERDAL) 0.25 MG tablet Take 0.25 mg by mouth every morning.     sertraline (ZOLOFT) 100 MG tablet Take 200 mg by mouth in the morning.     Travoprost, BAK Free, (TRAVATAN) 0.004 % SOLN ophthalmic solution Place 1 drop into both eyes at bedtime.     traZODone (DESYREL) 50 MG tablet Take 50 mg by mouth at bedtime.     VISINE ADVANCED RELIEF 0.05-0.1-1-1 % SOLN Place 1 drop into both eyes 3 (three) times daily as needed (for irritation).     Spacer/Aero-Holding Chambers (AEROCHAMBER PLUS FLO-VU LARGE) DEVI SMARTSIG:Inhaler(s) Via Inhaler PRN       Discharge Medications: Please see discharge summary for a list of discharge medications.  Relevant Imaging Results:  Relevant Lab Results:   Additional Information SSN: [id number]  Princella Ion, LCSW
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
## 2022-12-16 DIAGNOSIS — R627 Adult failure to thrive: Secondary | ICD-10-CM

## 2022-12-16 DIAGNOSIS — Z1330 Encounter for screening examination for mental health and behavioral disorders, unspecified: Secondary | ICD-10-CM
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
## 2022-12-16 NOTE — ED Notes (Signed)
Patient has been alert.  Patient needs assist with ADLs.  Patient mostly cooperative with care.   Patient can express needs and wants. Patient in impulsive.

## 2022-12-16 NOTE — Progress Notes (Addendum)
This CSW attempted to contact [facility] in [place] to follow up on referral. [facility] in [place] declined due to no bed availability.   Addend @ 11:02 AM Informed by Psych NP's that pt has capacity and is agreeing to SNF - note to follow. This CSW met with pt at bedside and contacted pt's husband, Barbara Gallegos, via phone. They have selected [facility] for SNF - TOC to initiate Auth.   Addend @ 11:14 AM Auth initiated.

## 2022-12-16 NOTE — Progress Notes (Signed)
CSW has checked patients AUTH at this time AUTH is still pending @ 8:04 pm   Late Entry: CSW received a call from [facility]/ INTAKE @ 3:28 pm  5/30 requesting that the patient's medication needed to be changed or provide cheaper medication for the patient's DC. This Request was passed on to the [facility]. This CSW made Whitney aware that AUTH was still pending.TOC will continue to follow.

## 2022-12-16 NOTE — Progress Notes (Signed)
CSW has checked NAVI at this time Barbara Gallegos is still pending.  TOC will continue to check through out shift.
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
## 2022-12-17 NOTE — ED Notes (Signed)
Patient discharged off unit to SNF per provider. Patient alert and cooperative, no s/s of distress. Patient discharge information and belongings given to [facility] for transport. Patient off unit on stretcher. Patient escorted and transported by [facility].

## 2022-12-17 NOTE — Progress Notes (Signed)
Auth is still pending

## 2022-12-17 NOTE — ED Notes (Signed)
Sitter remains at beside d/t pt be high risk for falls,. Minimal risk taking behavior noted pt able to follow redirection by staff with mild push back.
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
## 2022-12-17 NOTE — Discharge Instructions (Addendum)
It was our pleasure to provide your ER care today - we hope that you feel better.  Drink plenty of fluids/stay well hydrated. Eat balanced diet, get adequate nutrition.  Follow up with primary care doctor in one week.  Return to ER if worse, new symptoms, fevers, new/severe pain, chest pain, trouble breathing, abdominal pain, or othe concern.

## 2022-12-20 NOTE — Telephone Encounter (Signed)
Mychart msg returned unread.   Called pt, no answer, LDVM on machine per DPR.  Will close encounter.

## 2023-03-21 DIAGNOSIS — G10 Huntington's disease: Secondary | ICD-10-CM | POA: Diagnosis not present

## 2023-03-21 DIAGNOSIS — R1312 Dysphagia, oropharyngeal phase: Secondary | ICD-10-CM | POA: Diagnosis not present

## 2023-03-21 DIAGNOSIS — M6281 Muscle weakness (generalized): Secondary | ICD-10-CM | POA: Diagnosis not present

## 2023-03-21 DIAGNOSIS — R41841 Cognitive communication deficit: Secondary | ICD-10-CM | POA: Diagnosis not present

## 2023-04-01 DIAGNOSIS — F331 Major depressive disorder, recurrent, moderate: Secondary | ICD-10-CM | POA: Diagnosis not present

## 2023-04-06 DIAGNOSIS — F329 Major depressive disorder, single episode, unspecified: Secondary | ICD-10-CM | POA: Diagnosis not present

## 2023-04-06 DIAGNOSIS — F0630 Mood disorder due to known physiological condition, unspecified: Secondary | ICD-10-CM | POA: Diagnosis not present

## 2023-04-06 DIAGNOSIS — G4700 Insomnia, unspecified: Secondary | ICD-10-CM | POA: Diagnosis not present

## 2023-04-06 DIAGNOSIS — G10 Huntington's disease: Secondary | ICD-10-CM | POA: Diagnosis not present

## 2023-04-07 DIAGNOSIS — R059 Cough, unspecified: Secondary | ICD-10-CM | POA: Diagnosis not present

## 2023-04-07 DIAGNOSIS — N951 Menopausal and female climacteric states: Secondary | ICD-10-CM | POA: Diagnosis not present

## 2023-04-07 DIAGNOSIS — G10 Huntington's disease: Secondary | ICD-10-CM | POA: Diagnosis not present

## 2023-04-07 DIAGNOSIS — H409 Unspecified glaucoma: Secondary | ICD-10-CM | POA: Diagnosis not present

## 2023-04-07 DIAGNOSIS — F39 Unspecified mood [affective] disorder: Secondary | ICD-10-CM | POA: Diagnosis not present

## 2023-04-07 DIAGNOSIS — F5101 Primary insomnia: Secondary | ICD-10-CM | POA: Diagnosis not present

## 2023-04-08 DIAGNOSIS — F5101 Primary insomnia: Secondary | ICD-10-CM | POA: Diagnosis not present

## 2023-04-08 DIAGNOSIS — Z03818 Encounter for observation for suspected exposure to other biological agents ruled out: Secondary | ICD-10-CM | POA: Diagnosis not present

## 2023-04-08 DIAGNOSIS — H409 Unspecified glaucoma: Secondary | ICD-10-CM | POA: Diagnosis not present

## 2023-04-08 DIAGNOSIS — F39 Unspecified mood [affective] disorder: Secondary | ICD-10-CM | POA: Diagnosis not present

## 2023-04-08 DIAGNOSIS — I10 Essential (primary) hypertension: Secondary | ICD-10-CM | POA: Diagnosis not present

## 2023-04-08 DIAGNOSIS — E119 Type 2 diabetes mellitus without complications: Secondary | ICD-10-CM | POA: Diagnosis not present

## 2023-04-08 DIAGNOSIS — G10 Huntington's disease: Secondary | ICD-10-CM | POA: Diagnosis not present

## 2023-04-09 DIAGNOSIS — R059 Cough, unspecified: Secondary | ICD-10-CM | POA: Diagnosis not present

## 2023-04-15 DIAGNOSIS — F331 Major depressive disorder, recurrent, moderate: Secondary | ICD-10-CM | POA: Diagnosis not present

## 2023-04-19 DIAGNOSIS — R1312 Dysphagia, oropharyngeal phase: Secondary | ICD-10-CM | POA: Diagnosis not present

## 2023-04-19 DIAGNOSIS — M79661 Pain in right lower leg: Secondary | ICD-10-CM | POA: Diagnosis not present

## 2023-04-19 DIAGNOSIS — R41841 Cognitive communication deficit: Secondary | ICD-10-CM | POA: Diagnosis not present

## 2023-04-19 DIAGNOSIS — G10 Huntington's disease: Secondary | ICD-10-CM | POA: Diagnosis not present

## 2023-04-19 DIAGNOSIS — M79662 Pain in left lower leg: Secondary | ICD-10-CM | POA: Diagnosis not present

## 2023-04-29 DIAGNOSIS — F331 Major depressive disorder, recurrent, moderate: Secondary | ICD-10-CM | POA: Diagnosis not present

## 2023-05-04 DIAGNOSIS — G4701 Insomnia due to medical condition: Secondary | ICD-10-CM | POA: Diagnosis not present

## 2023-05-04 DIAGNOSIS — F32A Depression, unspecified: Secondary | ICD-10-CM | POA: Diagnosis not present

## 2023-05-04 DIAGNOSIS — G10 Huntington's disease: Secondary | ICD-10-CM | POA: Diagnosis not present

## 2023-05-13 DIAGNOSIS — F331 Major depressive disorder, recurrent, moderate: Secondary | ICD-10-CM | POA: Diagnosis not present

## 2023-05-17 DIAGNOSIS — G10 Huntington's disease: Secondary | ICD-10-CM | POA: Diagnosis not present

## 2023-05-17 DIAGNOSIS — G4701 Insomnia due to medical condition: Secondary | ICD-10-CM | POA: Diagnosis not present

## 2023-05-17 DIAGNOSIS — F331 Major depressive disorder, recurrent, moderate: Secondary | ICD-10-CM | POA: Diagnosis not present

## 2023-05-18 DIAGNOSIS — G10 Huntington's disease: Secondary | ICD-10-CM | POA: Diagnosis not present

## 2023-05-18 DIAGNOSIS — F39 Unspecified mood [affective] disorder: Secondary | ICD-10-CM | POA: Diagnosis not present

## 2023-05-18 DIAGNOSIS — H409 Unspecified glaucoma: Secondary | ICD-10-CM | POA: Diagnosis not present

## 2023-05-18 DIAGNOSIS — F5101 Primary insomnia: Secondary | ICD-10-CM | POA: Diagnosis not present

## 2023-05-20 DIAGNOSIS — R1312 Dysphagia, oropharyngeal phase: Secondary | ICD-10-CM | POA: Diagnosis not present

## 2023-05-20 DIAGNOSIS — G10 Huntington's disease: Secondary | ICD-10-CM | POA: Diagnosis not present

## 2023-05-20 DIAGNOSIS — R41841 Cognitive communication deficit: Secondary | ICD-10-CM | POA: Diagnosis not present

## 2023-06-02 DIAGNOSIS — G10 Huntington's disease: Secondary | ICD-10-CM | POA: Diagnosis not present

## 2023-06-02 DIAGNOSIS — W19XXXA Unspecified fall, initial encounter: Secondary | ICD-10-CM | POA: Diagnosis not present

## 2023-06-03 DIAGNOSIS — F331 Major depressive disorder, recurrent, moderate: Secondary | ICD-10-CM | POA: Diagnosis not present

## 2023-06-06 DIAGNOSIS — H409 Unspecified glaucoma: Secondary | ICD-10-CM | POA: Diagnosis not present

## 2023-06-06 DIAGNOSIS — F39 Unspecified mood [affective] disorder: Secondary | ICD-10-CM | POA: Diagnosis not present

## 2023-06-06 DIAGNOSIS — G10 Huntington's disease: Secondary | ICD-10-CM | POA: Diagnosis not present

## 2023-06-06 DIAGNOSIS — F5101 Primary insomnia: Secondary | ICD-10-CM | POA: Diagnosis not present

## 2023-06-08 DIAGNOSIS — U071 COVID-19: Secondary | ICD-10-CM | POA: Diagnosis not present

## 2023-06-08 DIAGNOSIS — J069 Acute upper respiratory infection, unspecified: Secondary | ICD-10-CM | POA: Diagnosis not present

## 2023-06-10 DIAGNOSIS — U071 COVID-19: Secondary | ICD-10-CM | POA: Diagnosis not present

## 2023-06-10 DIAGNOSIS — J069 Acute upper respiratory infection, unspecified: Secondary | ICD-10-CM | POA: Diagnosis not present

## 2023-06-13 DIAGNOSIS — G10 Huntington's disease: Secondary | ICD-10-CM | POA: Diagnosis not present

## 2023-06-13 DIAGNOSIS — J069 Acute upper respiratory infection, unspecified: Secondary | ICD-10-CM | POA: Diagnosis not present

## 2023-06-13 DIAGNOSIS — U071 COVID-19: Secondary | ICD-10-CM | POA: Diagnosis not present

## 2023-06-13 DIAGNOSIS — G4701 Insomnia due to medical condition: Secondary | ICD-10-CM | POA: Diagnosis not present

## 2023-06-13 DIAGNOSIS — F331 Major depressive disorder, recurrent, moderate: Secondary | ICD-10-CM | POA: Diagnosis not present

## 2023-06-20 DIAGNOSIS — G1221 Amyotrophic lateral sclerosis: Secondary | ICD-10-CM | POA: Diagnosis not present

## 2023-06-20 DIAGNOSIS — R2681 Unsteadiness on feet: Secondary | ICD-10-CM | POA: Diagnosis not present

## 2023-06-20 DIAGNOSIS — R41841 Cognitive communication deficit: Secondary | ICD-10-CM | POA: Diagnosis not present

## 2023-06-20 DIAGNOSIS — G10 Huntington's disease: Secondary | ICD-10-CM | POA: Diagnosis not present

## 2023-06-20 DIAGNOSIS — M6281 Muscle weakness (generalized): Secondary | ICD-10-CM | POA: Diagnosis not present

## 2023-06-20 DIAGNOSIS — R1312 Dysphagia, oropharyngeal phase: Secondary | ICD-10-CM | POA: Diagnosis not present

## 2023-06-20 DIAGNOSIS — R278 Other lack of coordination: Secondary | ICD-10-CM | POA: Diagnosis not present

## 2023-07-18 DIAGNOSIS — F329 Major depressive disorder, single episode, unspecified: Secondary | ICD-10-CM | POA: Diagnosis not present

## 2023-07-18 DIAGNOSIS — F411 Generalized anxiety disorder: Secondary | ICD-10-CM | POA: Diagnosis not present

## 2024-05-01 ENCOUNTER — Other Ambulatory Visit: Payer: Self-pay

## 2024-05-01 ENCOUNTER — Emergency Department

## 2024-05-01 ENCOUNTER — Emergency Department
Admission: EM | Admit: 2024-05-01 | Discharge: 2024-05-01 | Disposition: A | Discharge status: Home / Self Care | Attending: Emergency Medicine | Admitting: Emergency Medicine

## 2024-05-01 DIAGNOSIS — W06XXXA Fall from bed, initial encounter: Secondary | ICD-10-CM | POA: Diagnosis not present

## 2024-05-01 DIAGNOSIS — S0083XA Contusion of other part of head, initial encounter: Secondary | ICD-10-CM

## 2024-05-01 DIAGNOSIS — Y92003 Bedroom of unspecified non-institutional (private) residence as the place of occurrence of the external cause: Secondary | ICD-10-CM | POA: Diagnosis not present

## 2024-05-01 DIAGNOSIS — S0181XA Laceration without foreign body of other part of head, initial encounter: Secondary | ICD-10-CM | POA: Diagnosis present

## 2024-05-01 DIAGNOSIS — W19XXXA Unspecified fall, initial encounter: Secondary | ICD-10-CM

## 2024-05-01 MED ORDER — LIDOCAINE-EPINEPHRINE-TETRACAINE (LET) TOPICAL GEL
3.0000 mL | Freq: Once | TOPICAL | Status: AC
  Administered 2024-05-01: 3 mL via TOPICAL
  Filled 2024-05-01: qty 3

## 2024-05-01 MED ORDER — LORAZEPAM 1 MG PO TABS
1.0000 mg | ORAL_TABLET | Freq: Once | ORAL | Status: AC
  Administered 2024-05-01: 1 mg via ORAL
  Filled 2024-05-01: qty 1

## 2024-05-01 MED ORDER — HYDROCODONE-ACETAMINOPHEN 5-325 MG PO TABS: 1.0000 | ORAL_TABLET | Freq: Once | ORAL | Status: DC

## 2024-05-01 NOTE — ED Provider Notes (Signed)
 [facility] Emergency Department Provider Note     Event Date/Time   First MD Initiated Contact with Patient 05/01/24 1824     (approximate)   History   Fall   HPI  Barbara Gallegos is a 53 y.o. female with a history of Huntington's disease, ALS, and prediabetes, presents to the ED from [facility].  Patient presents via EMS after an unwitnessed fall.  Patient reportedly was trying to transition from her bed after coming from the bathroom.  She reports that she hit her head on the foot rest of her wheelchair.  Apparently she was able to get herself off the floor and into her bed.  It was several hours before staff was aware that she had the fall and subsequent forehead laceration.  They came and found the patient in the bed covered in blood from her forehead laceration.  She presents to the ED now with bleeding controlled.  Patient reports a possible momentary LOC.  She denies any subsequent nausea, vomiting, or dizziness.  Her husband is at bedside, reports that she is at her cognitive baseline at this time.  No other injuries reported related to the incident.  Physical Exam   Triage Vital Signs: ED Triage Vitals  Encounter Vitals Group     BP 05/01/24 1728 (!) 110/54     Girls Systolic BP Percentile --      Girls Diastolic BP Percentile --      Boys Systolic BP Percentile --      Boys Diastolic BP Percentile --      Pulse Rate 05/01/24 1728 84     Resp 05/01/24 1728 18     Temp 05/01/24 1728 98 F (36.7 C)     Temp src --      SpO2 05/01/24 1728 94 %     Weight --      Height --      Head Circumference --      Peak Flow --      Pain Score 05/01/24 1726 4     Pain Loc --      Pain Education --      Exclude from Growth Chart --     Most recent vital signs: Vitals:   05/01/24 1728  BP: (!) 110/54  Pulse: 84  Resp: 18  Temp: 98 F (36.7 C)  SpO2: 94%    General Awake, no distress. NAD HEENT NCAT, except for a linear laceration to  the central forehead.  No active bleeding noted at this time. PERRL. EOMI. No rhinorrhea. Mucous membranes are moist.  CV:  Good peripheral perfusion.  MSK:  AROM of all extremities.  Chorea noted NEURO: Cranial nerves II to XII grossly intact   ED Results / Procedures / Treatments   Labs (all labs ordered are listed, but only abnormal results are displayed) Labs Reviewed - No data to display   EKG   RADIOLOGY  I personally viewed and evaluated these images as part of my medical decision making, as well as reviewing the written report by the radiologist.  ED Provider Interpretation: No acute intracranial, max face, or cervical injuries noted.  CT Head Wo Contrast Result Date: 05/01/2024 EXAM: CT HEAD, FACIAL BONES AND CERVICAL SPINE WITHOUT CONTRAST 05/01/2024 09:32:51 PM TECHNIQUE: CT of the head, facial bones and cervical spine was performed without the administration of intravenous contrast. Multiplanar reformatted images are provided for review. Automated exposure control, iterative reconstruction, and/or weight based adjustment of  the mA/kV was utilized to reduce the radiation dose to as low as reasonably achievable. COMPARISON: CT head/cervical spine dated 11/27/2020. CLINICAL HISTORY: Fall. First Nurse Note: Patient to ED via [facility] from [facility] for a fall. PT found in her bed by staff covered in blood. Laceration to forehead- bleeding controlled. Denies blood thinner. Hx Huntington's disease. At baseline per staff. FINDINGS: CT HEAD BRAIN AND VENTRICLES: No acute intracranial hemorrhage. No mass effect or midline shift. No extra-axial fluid collection. No evidence of acute infarct. No hydrocephalus. Mild cortical atrophy, frontal lobe predominant. SKULL AND SCALP: No acute skull fracture. No scalp hematoma. CT FACIAL BONES FACIAL BONES: No acute facial fracture. No mandibular dislocation. No suspicious bone lesion. ORBITS: No acute traumatic injury. SINUSES AND MASTOIDS: No  acute abnormality. SOFT TISSUES: No acute abnormality. CT CERVICAL SPINE BONES AND ALIGNMENT: No acute fracture or traumatic malalignment. 3 mm anterolisthesis of C4 on C5. DEGENERATIVE CHANGES: Mild degenerative changes of the mid to lower cervical spine. SOFT TISSUES: No prevertebral soft tissue swelling. LIMITATIONS/ARTIFACTS: Motion degraded images. IMPRESSION: 1. Motion degraded images. 2. No acute intracranial abnormality. 3. No acute traumatic injury to the cervical spine. 4. No acute facial bone fracture. Electronically signed by: Pinkie Pebbles MD 05/01/2024 09:37 PM EDT RP Workstation: [id number]   CT Maxillofacial Wo Contrast Result Date: 05/01/2024 EXAM: CT HEAD, FACIAL BONES AND CERVICAL SPINE WITHOUT CONTRAST 05/01/2024 09:32:51 PM TECHNIQUE: CT of the head, facial bones and cervical spine was performed without the administration of intravenous contrast. Multiplanar reformatted images are provided for review. Automated exposure control, iterative reconstruction, and/or weight based adjustment of the mA/kV was utilized to reduce the radiation dose to as low as reasonably achievable. COMPARISON: CT head/cervical spine dated 11/27/2020. CLINICAL HISTORY: Fall. First Nurse Note: Patient to ED via [facility] from [facility] for a fall. PT found in her bed by staff covered in blood. Laceration to forehead- bleeding controlled. Denies blood thinner. Hx Huntington's disease. At baseline per staff. FINDINGS: CT HEAD BRAIN AND VENTRICLES: No acute intracranial hemorrhage. No mass effect or midline shift. No extra-axial fluid collection. No evidence of acute infarct. No hydrocephalus. Mild cortical atrophy, frontal lobe predominant. SKULL AND SCALP: No acute skull fracture. No scalp hematoma. CT FACIAL BONES FACIAL BONES: No acute facial fracture. No mandibular dislocation. No suspicious bone lesion. ORBITS: No acute traumatic injury. SINUSES AND MASTOIDS: No acute abnormality. SOFT TISSUES: No acute  abnormality. CT CERVICAL SPINE BONES AND ALIGNMENT: No acute fracture or traumatic malalignment. 3 mm anterolisthesis of C4 on C5. DEGENERATIVE CHANGES: Mild degenerative changes of the mid to lower cervical spine. SOFT TISSUES: No prevertebral soft tissue swelling. LIMITATIONS/ARTIFACTS: Motion degraded images. IMPRESSION: 1. Motion degraded images. 2. No acute intracranial abnormality. 3. No acute traumatic injury to the cervical spine. 4. No acute facial bone fracture. Electronically signed by: Pinkie Pebbles MD 05/01/2024 09:37 PM EDT RP Workstation: [id number]   CT Cervical Spine Wo Contrast Result Date: 05/01/2024 EXAM: CT HEAD, FACIAL BONES AND CERVICAL SPINE WITHOUT CONTRAST 05/01/2024 09:32:51 PM TECHNIQUE: CT of the head, facial bones and cervical spine was performed without the administration of intravenous contrast. Multiplanar reformatted images are provided for review. Automated exposure control, iterative reconstruction, and/or weight based adjustment of the mA/kV was utilized to reduce the radiation dose to as low as reasonably achievable. COMPARISON: CT head/cervical spine dated 11/27/2020. CLINICAL HISTORY: Fall. First Nurse Note: Patient to ED via [facility] from [facility] for a fall. PT found in her bed by  staff covered in blood. Laceration to forehead- bleeding controlled. Denies blood thinner. Hx Huntington's disease. At baseline per staff. FINDINGS: CT HEAD BRAIN AND VENTRICLES: No acute intracranial hemorrhage. No mass effect or midline shift. No extra-axial fluid collection. No evidence of acute infarct. No hydrocephalus. Mild cortical atrophy, frontal lobe predominant. SKULL AND SCALP: No acute skull fracture. No scalp hematoma. CT FACIAL BONES FACIAL BONES: No acute facial fracture. No mandibular dislocation. No suspicious bone lesion. ORBITS: No acute traumatic injury. SINUSES AND MASTOIDS: No acute abnormality. SOFT TISSUES: No acute abnormality. CT CERVICAL SPINE BONES AND  ALIGNMENT: No acute fracture or traumatic malalignment. 3 mm anterolisthesis of C4 on C5. DEGENERATIVE CHANGES: Mild degenerative changes of the mid to lower cervical spine. SOFT TISSUES: No prevertebral soft tissue swelling. LIMITATIONS/ARTIFACTS: Motion degraded images. IMPRESSION: 1. Motion degraded images. 2. No acute intracranial abnormality. 3. No acute traumatic injury to the cervical spine. 4. No acute facial bone fracture. Electronically signed by: Pinkie Pebbles MD 05/01/2024 09:37 PM EDT RP Workstation: [id number]     PROCEDURES:  Critical Care performed: No  .Laceration Repair  Date/Time: 05/01/2024 9:50 PM  Performed by: Loyd Candida LULLA Aldona, PA-C Authorized by: Loyd Candida LULLA Aldona, PA-C   Consent:    Consent obtained:  Verbal   Consent given by:  Patient and spouse   Risks, benefits, and alternatives were discussed: yes     Risks discussed:  Pain, poor cosmetic result and poor wound healing Universal protocol:    Imaging studies available: yes     Site/side marked: yes     Patient identity confirmed:  Verbally with patient Anesthesia:    Anesthesia method:  Topical application   Topical anesthetic:  LET Laceration details:    Location:  Face   Face location:  Forehead   Length (cm):  4   Depth (mm):  5 Exploration:    Limited defect created (wound extended): no     Hemostasis achieved with:  Direct pressure and LET   Contaminated: no   Treatment:    Area cleansed with:  Saline   Amount of cleaning:  Standard   Irrigation solution:  Sterile saline   Irrigation volume:  20   Irrigation method:  Tap   Debridement:  None   Undermining:  None   Scar revision: no   Skin repair:    Repair method:  Tissue adhesive Approximation:    Approximation:  Close Repair type:    Repair type:  Simple Post-procedure details:    Dressing:  Open (no dressing)   Procedure completion:  Tolerated well, no immediate complications    MEDICATIONS ORDERED IN  ED: Medications  lidocaine -EPINEPHrine -tetracaine (LET) topical gel (3 mLs Topical Given 05/01/24 1950)  LORazepam  (ATIVAN ) tablet 1 mg (1 mg Oral Given 05/01/24 1949)     IMPRESSION / MDM / ASSESSMENT AND PLAN / ED COURSE  I reviewed the triage vital signs and the nursing notes.                              Differential diagnosis includes, but is not limited to, subdural hemorrhage, facial fracture, contusion, concussion, abrasion, laceration, cervical radiculopathy, cervical fracture, myalgias  Patient's presentation is most consistent with acute complicated illness / injury requiring diagnostic workup.  Patient's diagnosis is consistent with unwitnessed mechanical fall resulting in facial contusion and laceration.  Patient with Huntington's chorea presents via EMS from her facility for evaluation management of her mechanical fall  resulting in facial contusion.  Forehead laceration noted with no active bleeding on presentation.  Patient is at cognitive baseline and according to her husband was at bedside.  CT images interpreted by me of the head, max face, cervical spine are without any acute findings.  Wound is repaired using Dermabond, with good wound edge approximation.  Patient will be discharged home with wound care instructions. Patient is to follow up with the primary provider as discussed, as needed or otherwise directed. Patient is given ED precautions to return to the ED for any worsening or new symptoms.   FINAL CLINICAL IMPRESSION(S) / ED DIAGNOSES   Final diagnoses:  Fall in home, initial encounter  Contusion of face, initial encounter  Facial laceration, initial encounter     Rx / DC Orders   ED Discharge Orders     None        Note:  This document was prepared using Dragon voice recognition software and may include unintentional dictation errors.    Loyd Candida LULLA Aldona, PA-C 05/01/24 2253    Dorothyann Drivers, MD 05/01/24 2332

## 2024-05-01 NOTE — ED Triage Notes (Signed)
 Pt comes via EMs from [facility] with c/o fall. Pt has huntington disease. Pt has laceration to forehead. Pt not on thinners. Pt states she tripped and fell.

## 2024-05-01 NOTE — Discharge Instructions (Addendum)
 Your exam and CT scans are normal and reassuring following your fall.  Your facial lacerations have been repaired using wound glue.  Avoid any lotions, creams, oils, or ointments on the wound glue.  You may experience some bruising or swelling around the eyes due to the forehead contusion.  Give OTC Tylenol  as needed for pain and follow-up with primary provider as needed.

## 2024-05-01 NOTE — ED Triage Notes (Signed)
 First Nurse Note: Patient to ED via [facility] from [facility] for a fall. PT found in her bed by staff covered in blood. Laceration to forehead- bleeding controlled. Denies blood thinner. Hx Huntington's disease. At baseline per staff.  92 HR 121/71 98%

## 2024-05-01 NOTE — ED Notes (Signed)
 Patient transported to CT

## 2024-05-01 NOTE — ED Notes (Signed)
 CT unable to get scans at this time due to patient moving.

## 2024-05-02 NOTE — ED Notes (Addendum)
 This RN called the pt's husband at [id number] after pt was discharged d/t DNR form and paperwork being left in room. The pt's husband stated that he would drop the pt off at [facility] facility and then come to retrieve the form and paperwork.
